# Patient Record
Sex: Female | Born: 1970 | Race: Black or African American | Hispanic: No | Smoking: Never smoker
Health system: Southern US, Community
[De-identification: ages and names within clinical notes are randomized; demographics above are authoritative.]

## PROBLEM LIST (undated history)

## (undated) DIAGNOSIS — E785 Hyperlipidemia, unspecified: Secondary | ICD-10-CM

## (undated) HISTORY — PX: ROBOTIC ASSISTED LAPAROSCOPIC VAGINAL HYSTERECTOMY WITH FIBROID REMOVAL: SHX5387

## (undated) HISTORY — PX: NECK SURGERY: SHX720

## (undated) HISTORY — PX: ABDOMINAL HYSTERECTOMY: SHX81

## (undated) HISTORY — PX: ELBOW SURGERY: SHX618

---

## 1998-01-15 ENCOUNTER — Observation Stay (HOSPITAL_COMMUNITY): Admission: EM | Admit: 1998-01-15 | Discharge: 1998-01-15 | Payer: Self-pay | Admitting: Emergency Medicine

## 1998-01-15 ENCOUNTER — Inpatient Hospital Stay (HOSPITAL_COMMUNITY): Admission: AD | Admit: 1998-01-15 | Discharge: 1998-01-17 | Payer: Self-pay | Admitting: *Deleted

## 1998-08-06 ENCOUNTER — Ambulatory Visit (HOSPITAL_COMMUNITY): Admission: RE | Admit: 1998-08-06 | Discharge: 1998-08-06 | Payer: Self-pay | Admitting: Family Medicine

## 1998-08-07 ENCOUNTER — Encounter: Payer: Self-pay | Admitting: Family Medicine

## 1998-08-20 ENCOUNTER — Other Ambulatory Visit: Admission: RE | Admit: 1998-08-20 | Discharge: 1998-08-20 | Payer: Self-pay | Admitting: Family Medicine

## 1998-08-27 ENCOUNTER — Ambulatory Visit (HOSPITAL_COMMUNITY): Admission: RE | Admit: 1998-08-27 | Discharge: 1998-08-27 | Payer: Self-pay | Admitting: Family Medicine

## 1998-08-27 ENCOUNTER — Encounter: Payer: Self-pay | Admitting: Family Medicine

## 1998-10-04 ENCOUNTER — Ambulatory Visit (HOSPITAL_BASED_OUTPATIENT_CLINIC_OR_DEPARTMENT_OTHER): Admission: RE | Admit: 1998-10-04 | Discharge: 1998-10-04 | Payer: Self-pay | Admitting: Otolaryngology

## 1999-03-25 ENCOUNTER — Encounter: Payer: Self-pay | Admitting: Emergency Medicine

## 1999-03-25 ENCOUNTER — Emergency Department (HOSPITAL_COMMUNITY): Admission: EM | Admit: 1999-03-25 | Discharge: 1999-03-25 | Payer: Self-pay | Admitting: Emergency Medicine

## 2000-03-29 ENCOUNTER — Other Ambulatory Visit: Admission: RE | Admit: 2000-03-29 | Discharge: 2000-03-29 | Payer: Self-pay | Admitting: Obstetrics and Gynecology

## 2001-04-11 ENCOUNTER — Other Ambulatory Visit: Admission: RE | Admit: 2001-04-11 | Discharge: 2001-04-11 | Payer: Self-pay | Admitting: Obstetrics and Gynecology

## 2002-03-09 ENCOUNTER — Emergency Department (HOSPITAL_COMMUNITY): Admission: EM | Admit: 2002-03-09 | Discharge: 2002-03-09 | Payer: Self-pay | Admitting: *Deleted

## 2002-07-14 ENCOUNTER — Inpatient Hospital Stay (HOSPITAL_COMMUNITY): Admission: AD | Admit: 2002-07-14 | Discharge: 2002-07-14 | Payer: Self-pay | Admitting: Obstetrics and Gynecology

## 2002-09-27 ENCOUNTER — Inpatient Hospital Stay (HOSPITAL_COMMUNITY): Admission: RE | Admit: 2002-09-27 | Discharge: 2002-09-28 | Payer: Self-pay | Admitting: Gynecology

## 2002-09-27 ENCOUNTER — Encounter (INDEPENDENT_AMBULATORY_CARE_PROVIDER_SITE_OTHER): Payer: Self-pay | Admitting: Specialist

## 2003-12-17 ENCOUNTER — Encounter: Admission: RE | Admit: 2003-12-17 | Discharge: 2003-12-17 | Payer: Self-pay | Admitting: Gynecology

## 2004-07-01 ENCOUNTER — Other Ambulatory Visit: Admission: RE | Admit: 2004-07-01 | Discharge: 2004-07-01 | Payer: Self-pay | Admitting: Gynecology

## 2005-04-27 ENCOUNTER — Other Ambulatory Visit: Admission: RE | Admit: 2005-04-27 | Discharge: 2005-04-27 | Payer: Self-pay | Admitting: Gynecology

## 2005-05-12 ENCOUNTER — Encounter: Admission: RE | Admit: 2005-05-12 | Discharge: 2005-05-12 | Payer: Self-pay | Admitting: Gynecology

## 2005-09-01 ENCOUNTER — Encounter: Admission: RE | Admit: 2005-09-01 | Discharge: 2005-09-01 | Payer: Self-pay | Admitting: Family Medicine

## 2005-09-16 ENCOUNTER — Encounter: Admission: RE | Admit: 2005-09-16 | Discharge: 2005-09-16 | Payer: Self-pay | Admitting: Neurological Surgery

## 2005-10-28 ENCOUNTER — Ambulatory Visit (HOSPITAL_COMMUNITY): Admission: RE | Admit: 2005-10-28 | Discharge: 2005-10-29 | Payer: Self-pay | Admitting: Neurological Surgery

## 2005-12-01 ENCOUNTER — Encounter: Admission: RE | Admit: 2005-12-01 | Discharge: 2005-12-01 | Payer: Self-pay | Admitting: Neurological Surgery

## 2005-12-15 ENCOUNTER — Emergency Department (HOSPITAL_COMMUNITY): Admission: EM | Admit: 2005-12-15 | Discharge: 2005-12-15 | Payer: Self-pay | Admitting: Emergency Medicine

## 2006-02-01 ENCOUNTER — Encounter: Admission: RE | Admit: 2006-02-01 | Discharge: 2006-02-01 | Payer: Self-pay | Admitting: Neurological Surgery

## 2006-04-20 ENCOUNTER — Emergency Department (HOSPITAL_COMMUNITY): Admission: EM | Admit: 2006-04-20 | Discharge: 2006-04-20 | Payer: Self-pay | Admitting: *Deleted

## 2006-05-01 ENCOUNTER — Inpatient Hospital Stay (HOSPITAL_COMMUNITY): Admission: AD | Admit: 2006-05-01 | Discharge: 2006-05-01 | Payer: Self-pay | Admitting: Obstetrics and Gynecology

## 2006-05-03 ENCOUNTER — Encounter: Admission: RE | Admit: 2006-05-03 | Discharge: 2006-05-03 | Payer: Self-pay | Admitting: Neurological Surgery

## 2006-05-11 ENCOUNTER — Other Ambulatory Visit: Admission: RE | Admit: 2006-05-11 | Discharge: 2006-05-11 | Payer: Self-pay | Admitting: Gynecology

## 2006-06-16 ENCOUNTER — Encounter: Admission: RE | Admit: 2006-06-16 | Discharge: 2006-06-16 | Payer: Self-pay | Admitting: Gynecology

## 2006-08-10 ENCOUNTER — Emergency Department (HOSPITAL_COMMUNITY): Admission: EM | Admit: 2006-08-10 | Discharge: 2006-08-10 | Payer: Self-pay | Admitting: Emergency Medicine

## 2006-09-09 ENCOUNTER — Inpatient Hospital Stay (HOSPITAL_COMMUNITY): Admission: AD | Admit: 2006-09-09 | Discharge: 2006-09-09 | Payer: Self-pay | Admitting: Gynecology

## 2006-10-08 ENCOUNTER — Inpatient Hospital Stay (HOSPITAL_COMMUNITY): Admission: AD | Admit: 2006-10-08 | Discharge: 2006-10-08 | Payer: Self-pay | Admitting: Gynecology

## 2006-11-29 ENCOUNTER — Emergency Department (HOSPITAL_COMMUNITY): Admission: EM | Admit: 2006-11-29 | Discharge: 2006-11-29 | Payer: Self-pay | Admitting: Emergency Medicine

## 2006-12-29 ENCOUNTER — Inpatient Hospital Stay (HOSPITAL_COMMUNITY): Admission: AD | Admit: 2006-12-29 | Discharge: 2006-12-29 | Payer: Self-pay | Admitting: Gynecology

## 2007-07-05 ENCOUNTER — Other Ambulatory Visit: Admission: RE | Admit: 2007-07-05 | Discharge: 2007-07-05 | Payer: Self-pay | Admitting: Gynecology

## 2007-11-08 ENCOUNTER — Emergency Department (HOSPITAL_COMMUNITY): Admission: EM | Admit: 2007-11-08 | Discharge: 2007-11-09 | Payer: Self-pay | Admitting: Emergency Medicine

## 2009-05-06 ENCOUNTER — Encounter: Admission: RE | Admit: 2009-05-06 | Discharge: 2009-05-06 | Payer: Self-pay | Admitting: Gynecology

## 2009-05-13 ENCOUNTER — Encounter: Admission: RE | Admit: 2009-05-13 | Discharge: 2009-05-13 | Payer: Self-pay | Admitting: Gynecology

## 2009-06-03 ENCOUNTER — Emergency Department (HOSPITAL_COMMUNITY): Admission: EM | Admit: 2009-06-03 | Discharge: 2009-06-03 | Payer: Self-pay | Admitting: Emergency Medicine

## 2009-06-04 ENCOUNTER — Encounter: Admission: RE | Admit: 2009-06-04 | Discharge: 2009-06-04 | Payer: Self-pay | Admitting: Gynecology

## 2010-02-06 ENCOUNTER — Encounter (INDEPENDENT_AMBULATORY_CARE_PROVIDER_SITE_OTHER): Payer: Self-pay | Admitting: Obstetrics and Gynecology

## 2010-02-06 ENCOUNTER — Inpatient Hospital Stay (HOSPITAL_COMMUNITY): Admission: RE | Admit: 2010-02-06 | Discharge: 2010-02-09 | Payer: Self-pay | Admitting: Obstetrics and Gynecology

## 2010-02-16 ENCOUNTER — Inpatient Hospital Stay (HOSPITAL_COMMUNITY)
Admission: AD | Admit: 2010-02-16 | Discharge: 2010-02-19 | Payer: Self-pay | Source: Home / Self Care | Attending: Obstetrics and Gynecology | Admitting: Obstetrics and Gynecology

## 2010-03-30 ENCOUNTER — Encounter: Payer: Self-pay | Admitting: Gynecology

## 2010-03-30 ENCOUNTER — Encounter: Payer: Self-pay | Admitting: Gastroenterology

## 2010-03-30 ENCOUNTER — Encounter: Payer: Self-pay | Admitting: Neurological Surgery

## 2010-05-16 NOTE — Discharge Summary (Signed)
Mariah Anderson, Mariah Anderson               ACCOUNT NO.:  [id number]  MEDICAL RECORD NO.:  [id number]          PATIENT TYPE:  INP  LOCATION:  9303                          FACILITY:  [facility]  PHYSICIAN:  Dineen Kid. Rana Snare, M.D.    DATE OF BIRTH:  1970/03/16  DATE OF ADMISSION:  02/06/2010 DATE OF DISCHARGE:  02/09/2010                              DISCHARGE SUMMARY   HISTORY OF PRESENT ILLNESS:  Ms. Pflug is a 40 year old with worsening problems of pelvic pain and known history of adhesion.  She has had several surgeries in the past.  I told her that the next time she has surgery, she is to have these taken down and taken out as a recurrence of adhesions.  She has also has a left simple ovarian cyst, worsening problems with pelvic pain, known fibroids and abnormal bleeding.  She desires definitive surgical intervention and presents for hysterectomy. She desires preservation of both ovaries if at all possible.  She does have a long history of narcotic dependency and currently is on OxyContin for her back and understands that this may cause a postoperative pain management problem.  She does give informed consent and wished to proceed.  HOSPITAL COURSE:  The patient underwent a total abdominal hysterectomy with lysis of adhesions and left ovarian cystectomy.  At the time of surgery, we did a 8-week size fibroid uterus with multiple bowel and omental adhesions of the uterus and bilateral adnexa.  She did have a left simple ovarian cyst, normal-appearing right ovarian cyst and normal- appearing appendix.  The left ovary was preserved.  The surgery was uncomplicated with an estimated blood loss of 100 mL.  Her postoperative care was relatively unremarkable by postoperative day #1.  She had a postoperative hemoglobin of 10.2.  Her abdomen was soft and nontender with normoactive bowel sounds and a clean, dry incision, had difficulty maintaining pain control despite PCA Dilaudid and reinstituting  her OxyContin to a point that she would become sedated and not be able to ambulate.  By postoperative #2, she was tolerating regular diet, was able to ambulate.  We discontinued her Dilaudid PCA and switched her to oral Dilaudid, choosing moderate pain control.  By postoperative day #3, she remained afebrile.  She has normalized bowel sounds.  Incision was clean, dry and intact.  She was tolerating a regular diet, able to pass flatus and ambulating well.  She was discharged home.  I had a lengthy discussion with regarding her need to try to reduce the amount of Dilaudid that she was taking and to have an acceptable level of pain, so that she would be able to function and normal resumption of bowel function.  We did repeat a CBC and showed her stable hemoglobin and reassured her that she basically had to remain stable throughout her hospital course and the patient was discharged home.  I did send her on prescription for Dilaudid 2 mg #50.  I have instructed her to wean from these and not to rely on these at a time.  She does have a prescription for OxyContin.  She is to followup in the office in  2 weeks for routine check.  She is told to return for increased pain, fever or bleeding. Staples were removed before discharge.  Discharge condition was good.     Dineen Kid Rana Snare, M.D.     DCL/MEDQ  D:  02/27/2010  T:  02/28/2010  Job:  [id number]  Electronically Signed by Candice Camp M.D. on 05/15/2010 10:28:15 AM

## 2010-05-19 LAB — URINE CULTURE
Culture  Setup Time: 201112121916
Culture: NO GROWTH
Special Requests: POSITIVE

## 2010-05-19 LAB — COMPREHENSIVE METABOLIC PANEL
ALT: 12 U/L (ref 0–35)
BUN: 3 mg/dL — ABNORMAL LOW (ref 6–23)
Calcium: 9.5 mg/dL (ref 8.4–10.5)
Glucose, Bld: 96 mg/dL (ref 70–99)
Sodium: 139 mEq/L (ref 135–145)
Total Protein: 7.3 g/dL (ref 6.0–8.3)

## 2010-05-19 LAB — CBC
HCT: 30.2 % — ABNORMAL LOW (ref 36.0–46.0)
HCT: 37.2 % (ref 36.0–46.0)
Hemoglobin: 10.2 g/dL — ABNORMAL LOW (ref 12.0–15.0)
MCH: 31.1 pg (ref 26.0–34.0)
MCH: 31.4 pg (ref 26.0–34.0)
MCHC: 33 g/dL (ref 30.0–36.0)
MCHC: 33.6 g/dL (ref 30.0–36.0)
MCHC: 33.7 g/dL (ref 30.0–36.0)
MCV: 92.4 fL (ref 78.0–100.0)
MCV: 93.5 fL (ref 78.0–100.0)
Platelets: 235 10*3/uL (ref 150–400)
Platelets: 436 10*3/uL — ABNORMAL HIGH (ref 150–400)
RBC: 3.2 MIL/uL — ABNORMAL LOW (ref 3.87–5.11)
RDW: 13.9 % (ref 11.5–15.5)

## 2010-05-19 LAB — DIFFERENTIAL
Lymphs Abs: 1.9 10*3/uL (ref 0.7–4.0)
Monocytes Relative: 7 % (ref 3–12)
Neutro Abs: 2.6 10*3/uL (ref 1.7–7.7)
Neutrophils Relative %: 54 % (ref 43–77)

## 2010-05-20 LAB — COMPREHENSIVE METABOLIC PANEL WITH GFR
ALT: 13 U/L (ref 0–35)
AST: 21 U/L (ref 0–37)
Albumin: 3.8 g/dL (ref 3.5–5.2)
Alkaline Phosphatase: 50 U/L (ref 39–117)
BUN: 3 mg/dL — ABNORMAL LOW (ref 6–23)
CO2: 27 meq/L (ref 19–32)
Calcium: 9.1 mg/dL (ref 8.4–10.5)
Chloride: 100 meq/L (ref 96–112)
Creatinine, Ser: 0.98 mg/dL (ref 0.4–1.2)
GFR calc non Af Amer: >60 mL/min
Glucose, Bld: 90 mg/dL (ref 70–99)
Potassium: 3.1 meq/L — ABNORMAL LOW (ref 3.5–5.1)
Sodium: 135 meq/L (ref 135–145)
Total Bilirubin: 0.2 mg/dL — ABNORMAL LOW (ref 0.3–1.2)
Total Protein: 7.5 g/dL (ref 6.0–8.3)

## 2010-05-20 LAB — CBC
MCHC: 33.4 g/dL (ref 30.0–36.0)
RDW: 14.2 % (ref 11.5–15.5)

## 2010-05-20 LAB — SURGICAL PCR SCREEN
MRSA, PCR: NEGATIVE
Staphylococcus aureus: NEGATIVE

## 2010-07-15 NOTE — Discharge Summary (Signed)
  Mariah Anderson, Mariah Anderson               ACCOUNT NO.:  [id number]  MEDICAL RECORD NO.:  [id number]          PATIENT TYPE:  INP  LOCATION:  9303                          [facility]ACILITY:  [facility]  PHYSICIAN:  Dineen Kid. Rana Snare, M.D.    DATE OF BIRTH:  [birth year]  DATE OF ADMISSION:  02/16/2010 DATE OF DISCHARGE:  02/19/2010                              DISCHARGE SUMMARY   HISTORY OF PRESENT ILLNESS:  [name] is a 40 year old black female status post total abdominal hysterectomy, lysis of adhesions and removal of left ovarian cyst which was performed of February 06, 2010.  She presented complaining of worsening left-sided pain, 10/10 on pain scale. Since the surgery, she has not had a bowel movement in 7 days.  She has had occasional fever and chills.  She had some nausea and vomiting.  She was seen in the office several days prior to admission and started on antibiotics.  She presented to [facility] for evaluation and admitted by Dr. Vincente Poli for further observation.  HOSPITAL COURSE:  The patient was admitted for pain management.  She was given IV Stadol.  A CAT scan was ordered as well as laboratory evaluation.  The laboratory evaluation revealed a normal white count of 4.9 with a hemoglobin 12.3 and she also had a normal comprehensive metabolic package.  Her  CAT scan revealed a fluid collection within the ventral pelvic wall and some signs of increasing gas in the pelvis just below the rectus muscle.  This was felt to most likely represent fluid within the  rectus muscles.  There was also noted a distended bladder. The patient was given pain medication, was feeling somewhat better.  Her exam was really noncontributory but she had a completely dry and intact incision.  Pain was questionable in left lower quadrant, 1 out of 5 to deep palpation.  She was afebrile.  I reviewed the CT scan with the radiologist and also discussed with the patient at length.  The patient continued to complain of  intermittent left lower quadrant pain.  We tried enemas, increase Dulcolax suppositories.  The patient reported that pain was better.  She just does not feel right.  She was having some difficulty with voiding.  After voiding, we did a postvoid residual which basically showed that she was emptying completely.  After recommended she double void, she was able to void completely, was able to have bowel movements without difficulty.  She remained afebrile throughout the hospital course.  On February 19, 2010, her abdomen was soft, nontender, nondistended with normoactive bowel sounds and normal voiding function.  The patient was discharged home.  DISCHARGE DIAGNOSIS:  Postoperative pain, bowel dysfunction and bladder dysfunction but normal workup and evaluation.  DISPOSITION:  The patient discharged home, will followup in the office or return for increased pain, fever, or bleeding.    Dineen Kid Rana Snare, M.D.    DCL/MEDQ  D:  07/08/2010  T:  07/09/2010  Job:  [id number]  Electronically Signed by Candice Camp M.D. on 07/15/2010 09:05:04 AM

## 2010-07-15 NOTE — H&P (Signed)
  Mariah Anderson, Mariah Anderson               ACCOUNT NO.:  [id number]  MEDICAL RECORD NO.:  [id number]          PATIENT TYPE:  INP  LOCATION:  9302                          [facility]ACILITY:  [facility]  PHYSICIAN:  Dineen Kid. Rana Snare, M.D.    DATE OF BIRTH:  17-May-1970  DATE OF ADMISSION:  02/06/2010 DATE OF DISCHARGE:  02/09/2010                             HISTORY & PHYSICAL   HISTORY OF PRESENT ILLNESS:  Ms Swalley is a 40 year old with worsening pelvic pain with a known history of pelvic adhesions.  She has had multiple surgeries in the past.  I told her the next she has surgery to have her uterus taken out due to the chronic adhesions, she also has ovarian cyst, worsening problems with recent abnormal bleeding.  She desires surgical intervention and presents for hysterectomy.  PHYSICAL EXAMINATION:  PULMONARY:  Heart is regular rate and rhythm. LUNGS:  Clear to auscultation bilaterally. ABDOMEN:  Nondistended, nontender.  well healed scar. PELVIC:  Uterus is 10 weeks size.  Irregular, consistent with fibroid.  MEDICATIONS:  Include Lamictal and OxyContin due to history of ongoing chronic pain in her back.  ALLERGIES:  She has no known drug allergies.  SURGERIES:  She has had multiple abdominal surgeries due to pelvic pain and adhesions.  IMPRESSION:  Pelvic pain, uterine fibroids, abnormal uterine bleeding, pelvic adhesion, left ovarian cyst.  PLAN:  Total abdominal hysterectomy, lysis of adhesions, evaluation of probable left ovarian cystectomy, possible removal of left tube and ovary.  Discussed the surgery with risks, benefits, our goal would be to obviously alleviate her pain.  She does desire definitive surgical intervention, does desire preservation of both ovaries though we discussed risks and benefits at length which include but are not limited to risk of infection, bleeding, damage to bowel, bladder, ureters, ovaries, possibly her pain may not be alleviated or it can recur or worsen, risk  of blood transfusion, injury to bowel and bladder.  She does give her informed consent and wished to proceed.  I also had a lengthy discussion with her pain tolerance and dependency on OxyContin.  She does understand this may be an issue.     Dineen Kid Rana Snare, M.D.     DCL/MEDQ  D:  07/08/2010  T:  07/09/2010  Job:  [id number]  Electronically Signed by Candice Camp M.D. on 07/15/2010 09:04:57 AM

## 2010-07-25 NOTE — H&P (Signed)
NAME:  Mariah Anderson, Mariah Anderson                        ACCOUNT NO.:  [id number]   MEDICAL RECORD NO.:  [id number]                   PATIENT TYPE:  INP   LOCATION:  0005                                 FACILITY:  [facility]   PHYSICIAN:  Leatha Gilding. Mezer, M.D.               DATE OF BIRTH:  Sep 30, 1970   DATE OF ADMISSION:  09/27/2002  DATE OF DISCHARGE:                                HISTORY & PHYSICAL   ADMITTING DIAGNOSES:  Fibroid uterus and pelvic pain.   HISTORY OF PRESENT ILLNESS:  The patient is a 40 year old, gravida 1, para  1, African-American female, who is admitted with fibroids, pelvic pain,  dysmenorrhea, and dyspareunia for exploratory laparotomy and myomectomy.  The patient's symptoms have increased significantly over the last year and  wishes to undergo a myomectomy.  Abdominal myomectomy has been discussed in  detail with the patient and potential complications including but not  limited to anesthesia; injury to the bowel, bladder, ureters; possible  postoperative adhesions; possible need for hysterectomy have been discussed  with the patient in detail.  There is no guarantee to relieve the patient's  pelvic pain and dysmenorrhea, dyspareunia, or for her to become pregnant  postoperatively.  The potential postoperative adhesions and their  consequences including continued or increased pelvic pain has been discussed  in detail.  The potential for transfusions has been discussed as well as the  sequelae.  Postoperative expectations and restrictions have been reviewed in  detail.  It is likely the patient will be instructed not to get pregnant for  six months postoperatively because of the risk of uterine rupture.  Postoperative pain control has been discussed.  The patient appears to have  a good understanding of the procedure and the options and has realistic  expectations for outcome.   PAST MEDICAL HISTORY:   SURGICAL:  Vocal cords.   MEDICAL:  Noncontributory.   MEDICATIONS:  None.  No herbs or supplements.   ALLERGIES:  None known.   FAMILY HISTORY:  Positive for coronary artery disease and colon cancer as  well as for fibroids.   SOCIAL HISTORY:  The patient is married.   PHYSICAL EXAMINATION:  HEENT:  Negative.  LUNGS:  Clear.  HEART:  Without murmurs.  BREASTS:  The right breast is negative.  The left breast had a 2 x 3 cm mass  deep in the upper outer quadrant and was seen by Currie Paris, M.D.  preoperatively, who performed an ultrasound, identified this mass as cystic  and aspirated fluid.  The abdomen is soft and nontender.  The pelvic exam  reveals vagina and cervix to be normal.  The uterus is approximately 14  weeks in size and irregular with a prominent fundal myoma.  The adnexa are  without palpable masses.  EXTREMITIES:  Negative.   IMPRESSION:  1. A fibroid uterus.  2. Dysmenorrhea.  3. Dyspareunia.  4. Pelvic pain.  PLAN:  Exploratory laparotomy with myomectomy.                                               Leatha Gilding. Mezer, M.D.    HCM/MEDQ  D:  09/27/2002  T:  09/27/2002  Job:  [id number]   cc:   Leona Singleton, M.D.  [address]  [place]  [place] [address]  Fax: [phone]

## 2010-07-25 NOTE — Op Note (Signed)
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   PROCEDURES:  1. Decompressive anterior cervical diskectomy C5-6.  2. Anterior cervical arthrodesis C5-6 utilizing a 7 mm PEEK interbody cage      packed with a local autograft and DBX putty.  3. Anterior cervical plating C5-6 utilizing a 24 mm Accufix plate.   SURGEON:  Dr. Marikay Alar.   ASSISTANT:  Donalee Citrin, M.D.   ANESTHESIA:  General endotracheal.   COMPLICATIONS:  None apparent.   INDICATIONS FOR PROCEDURE:  Mariah Anderson is a 40 year old female who was  referred with numbness in the right side of her face, pain in her right  shoulder with pain down the right arm.  She had had a CT scan of the  cervical spine for workup of this which showed spondylosis at C5-6 on the  right side.  I recommended MRI of the brain, MRI of the cervical spine to  further work this up.  Her MRI of the brain was unimpressive.  Her MRI of  the cervical spine showed spondylosis with a small disk herniation at C5-6  on the right side with neural foraminal narrowing at C5-6 on the right.  We  tried medical management requested at that time without significant relief.  She returned complaining of worsening right arm pain.  I recommended  anterior cervical diskectomy and fusion with plating at C5-6.  She  understood the risks, benefits and expected outcome and she wished to  proceed.   DESCRIPTION OF PROCEDURE:  The patient was taken to the operating room  and  after induction of adequate generalized endotracheal anesthesia, she was  placed in the supine position on the operating table.  Her right anterior  cervical region was prepped with DuraPrep and then draped in the usual  sterile fashion.  We injected 3 mL of local anesthesia and a small  transverse incision was made to the right of midline and carried down to the  platysma which was elevated, opened and undermined with Metzenbaum scissors.  I then dissected in a plane medial to the sternocleidomastoid muscle and  internal carotid artery and lateral to the trachea and esophagus to expose  C5-6.  Intraoperative fluoroscopy confirmed my level and then the longus  colli muscles were taken down and the Shadow Line retractors were placed  under this.  The C5-6 annulus was incised and the initial diskectomy was  done with a pituitary rongeurs and curved curettes.  We then used the high-  speed drill to drill the endplates down to the level  of the posterior  longitudinal ligament.  We saved drill shavings in a mucous trap for later  arthrodesis.  The operating microscope was brought into the field and a 1 mm  Kerrison was used along with a black nerve hook to open the posterior  longitudinal ligament and then undercut the bodies of C5 and C6 in a  circumferential fashion removing the posterior osteophytes and the posterior  longitudinal ligament.  The left neural foramen was wide open preoperatively  and she had no left arm symptoms, therefore minimal foraminotomy was  performed on the left side.  However, on the right side, we spent  considerable time performing a wide generous foraminotomy over the C6 nerve  root.  We identified the medial pedicle border and followed it out  superiorly to identify the nerve root and followed it out into the foramen.  There was significant spurring over the nerve root in the foramen at C5-6 on  the right side especially superiorly.  This was removed with a 1  and 2 mm  Kerrison punch.  We then palpated with a black nerve hook to assure adequate  decompression of the C6 nerve root.  The C6 nerve root was free.  We  undercut the body of C5 somewhat further to decompress the central canal.  We then inspected our decompression.  The dura was capacious and full the  way across.  We felt like we had a good decompression of the C6 nerve root  on the right side which was our goal.  We then irrigated with saline  solution containing bacitracin, dried the surgical bed with Gelfoam, removed  the Gelfoam and then used a 7 mm PEEK interbody cage after measuring the  interspace and tapped this into position after packing it with local  autograft and PBX putty.  We then used a 24 mm Accufix plate and placed two  13 mm variable angle screws in the bodies of C5-C6, respectively, and these  locked into position by the locking mechanism on the plate.  We then  irrigated with saline solution containing bacitracin.  We dried all bleeding  points with bipolar cautery and once meticulous hemostasis was achieved,  closed the platysma with 3-0 Vicryl, closed the subcuticular tissue with 3-0  Vicryl and closed skin with Benzoin and Steri-Strips.  The drapes were  removed and a sterile dressing was applied.  The patient was awakened from  general anesthesia and transferred to the recovery room in stable condition.  At the end of the procedure, all sponge, needle and instrument counts were  correct.      Tia Alert, MD  Electronically Signed     DSJ/MEDQ  D:  10/28/2005  T:  10/28/2005  Job:  [id number]

## 2010-07-25 NOTE — Consult Note (Signed)
NAMEMARYSOL, WELLNITZ NO.:  [id number]   MEDICAL RECORD NO.:  [id number]          PATIENT TYPE:  MAT   LOCATION:  MATC                          FACILITY:  [facility]   PHYSICIAN:  Daniel L. Gottsegen, M.D.DATE OF BIRTH:  1970-11-04   DATE OF CONSULTATION:  05/01/2006  DATE OF DISCHARGE:                                 CONSULTATION   Mrs. Karle is a 40 year old gravida 1, para 1, AB 0 who has been  followed by Dr. Chevis Pretty with fibroids associated with pelvic pain and  menorrhagia.  She had previously had a myomectomy by Dr. Chevis Pretty and  unfortunately has had a regrowth of her fibroids.  She recently  underwent HSG because she is still contemplating having a second baby.  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  She called me last night because after having  had her usual period 2 weeks ago, she started to spot.  She had spotted  for several days and then last night it became very, very heavy.  She  had to change her pad every hour.  It was heavy with cramping.  When we  talked last night, I told her I was reluctant to give her any medication  to stop it because of the concern that she could be pregnant.  She was  advised to stay off her feet and use either Advil or Aleve and then to  call me back if it persisted, and we would see her.  It persisted fairly  well through the night.  She came to the emergency room this morning at  my request and actually in the process of coming to the emergency room  and being assessed, it has actually almost stopped.  A CBC and a  qualitative HCG were done in the emergency room.  At the time of this  dictation, the qualitative HCG is not available, but the hemoglobin was  only slightly low at 11.4.  She had been checked in his office about a  month ago and at that point had a normal hemoglobin.   EXAMINATION:  GENERAL:  The patient is a well-developed, well-nourished  female in no acute  distress.  VITAL SIGNS:  Blood pressure, pulse were stable.  ABDOMEN:  Soft guarding, rebound or masses.  PELVIC:  External is normal; BUS is normal.  Vaginal is normal.  Cervix  is clean.  There is just some very light spotting coming from her  external os.  Uterus is enlarged by multiple myomas to about 8-9 weeks'  size.  Adnexa failed to reveal masses.  She is nontender on exam.   IMPRESSION:  Myomas with menometrorrhagia.   PLAN:  I think for the moment she is stable and no prescription is  needed.  We will keep her until we are sure her pregnancy test is  negative..  Assuming that it is negative, I gave her a prescription for  Megace 40 mg #40.  She will take 1 b.i.d. to q.i.d. depending on the  bleeding in order to control a reoccurrence of this  bleeding.  She will  do that over the weekend.  Assuming it does not recur, she will just  call Dr. Chevis Pretty on Monday for further assessment.      Daniel L. Eda Paschal, M.D.  Electronically Signed     DLG/MEDQ  D:  05/01/2006  T:  05/01/2006  Job:  [id number]   cc:   Leatha Gilding. Mezer, M.D.  Fax: [phone]

## 2010-07-25 NOTE — Op Note (Signed)
NAME:  Mariah Anderson, Mariah Anderson                        ACCOUNT NO.:  [id number]   MEDICAL RECORD NO.:  [id number]                   PATIENT TYPE:  INP   LOCATION:  0005                                 FACILITY:  [facility]   PHYSICIAN:  Leatha Gilding. Mezer, M.D.               DATE OF BIRTH:  07-19-1970   DATE OF PROCEDURE:  09/27/2002  DATE OF DISCHARGE:                                 OPERATIVE REPORT   PREOPERATIVE DIAGNOSES:  Fibroid uterus, pelvic pain.   POSTOPERATIVE DIAGNOSES:  Fibroid uterus, pelvic pain. Adhesions.   OPERATION PERFORMED:  1. Exploratory laparotomy with myomectomy.  2. Extensive lysis of adhesions.   SURGEON:  Leatha Gilding. Mezer, M.D.   ASSISTANT:  Leona Singleton, M.D.   ANESTHESIA:  General endotracheal.   PREPARATION:  Betadine.   DESCRIPTION OF PROCEDURE:  With the patient in the supine position, she was  prepped and draped in routine fashion. A Pfannenstiel incision was made  through the skin and subcutaneous tissue. The fascia and peritoneum were  opened without difficulty. Brief exploration of her upper abdomen was  benign. Exploration of the pelvis revealed the uterus to be approximately 14  weeks in size with a large fundal myoma and a small myoma on the anterior  surface of the uterus. There were adhesions of bowel extending from the  posterior aspect of the uterus and involving both adnexa. Both ovaries were  adherent to the pelvic sidewall, both fallopian tubes were adherent to the  uterus and to the bowel on the left side. As much time was spent with lysis  of adhesions as was spent with the myomectomy. This degree of adhesions is  not anticipated when performing a myomectomy and is a distinctly separate  procedure. These adhesions were taken down in layers with cautery and  sharply as indicated. Very careful attention was paid to hemostasis as the  adhesions bled very easily when cut. Care was taken to protect the  underlying organs and particularly the  ureter and uterine artery and vein.  Great care was also taken to protect the bowel from injury. The left adnexa  was totally freed. The adhesions affecting the fallopian tube being adherent  to the uterus were taken down on the right. The right ovary was very  significantly in the pelvic sidewall and not adherent to the uterus and was  not extracted for fear that worse adhesions and worse pain would develop  postoperatively. Attention was then directed to the second procedure. A  solution of dilute Pitressin was injected at the base of the large fundal  fibroid. It was circumscribed below the equator and the fibroid removed. The  base of the myometrium was cauterized to achieve hemostasis. The fibroid was  deep in the myometrium but did not jeopardize the endometrial cavity. The  myometrial defect was closed in layers with a running #0 Chromic suture  followed by interrupted #0 Chromic sutures.  The serosal surface was closed  with a baseball type stitch of 3-0 PDS. A small anterior fibroid was removed  leaving a small defect which was closed with 3-0 PDS. Meticulous attention  was paid to hemostasis and at the completion of the procedure, an effort was  made to place the large bowel in the cul-de-sac. The omentum was brought  down. The bowel was somewhat dilated, possibly secondary to the use of  nitrous as an inhalation agent. The abdomen was then closed in layers using  a running 2-0 Vicryl on the peritoneum, running 0 Vicryl at the midline  bilaterally on the fascia. Hemostasis was assured in the subcutaneous  tissue, the skin was  closed with staples. The estimated blood loss was approximately 100 mL. The  sponge, needle and instrument counts were correct x2. The patient tolerated  the procedure well and was taken to the recovery room in satisfactory  condition.                                               Leatha Gilding. Mezer, M.D.    HCM/MEDQ  D:  09/27/2002  T:  09/27/2002  Job:   [id number]   cc:   Leona Singleton, M.D.  [address]  [place]  [place] [address]  Fax: [phone]

## 2010-10-29 ENCOUNTER — Ambulatory Visit
Admission: RE | Admit: 2010-10-29 | Discharge: 2010-10-29 | Disposition: A | Discharge status: Home / Self Care | Payer: 59 | Source: Ambulatory Visit | Attending: Neurological Surgery | Admitting: Neurological Surgery

## 2010-10-29 ENCOUNTER — Other Ambulatory Visit: Payer: Self-pay | Admitting: Neurological Surgery

## 2010-10-29 DIAGNOSIS — M542 Cervicalgia: Secondary | ICD-10-CM

## 2010-10-29 DIAGNOSIS — M545 Low back pain: Secondary | ICD-10-CM

## 2010-11-27 ENCOUNTER — Encounter (HOSPITAL_COMMUNITY)
Admission: RE | Admit: 2010-11-27 | Discharge: 2010-11-27 | Disposition: A | Discharge status: Home / Self Care | Payer: 59 | Source: Ambulatory Visit | Attending: Neurological Surgery | Admitting: Neurological Surgery

## 2010-11-27 ENCOUNTER — Other Ambulatory Visit (HOSPITAL_COMMUNITY): Payer: Self-pay | Admitting: Neurological Surgery

## 2010-11-27 ENCOUNTER — Ambulatory Visit (HOSPITAL_COMMUNITY)
Admission: RE | Admit: 2010-11-27 | Discharge: 2010-11-27 | Disposition: A | Discharge status: Home / Self Care | Payer: 59 | Source: Ambulatory Visit | Attending: Neurological Surgery | Admitting: Neurological Surgery

## 2010-11-27 DIAGNOSIS — M5030 Other cervical disc degeneration, unspecified cervical region: Secondary | ICD-10-CM | POA: Insufficient documentation

## 2010-11-27 DIAGNOSIS — M5020 Other cervical disc displacement, unspecified cervical region: Secondary | ICD-10-CM

## 2010-11-27 DIAGNOSIS — Z01810 Encounter for preprocedural cardiovascular examination: Secondary | ICD-10-CM | POA: Insufficient documentation

## 2010-11-27 DIAGNOSIS — Z01818 Encounter for other preprocedural examination: Secondary | ICD-10-CM | POA: Insufficient documentation

## 2010-11-27 DIAGNOSIS — Z01812 Encounter for preprocedural laboratory examination: Secondary | ICD-10-CM | POA: Insufficient documentation

## 2010-11-27 DIAGNOSIS — Z01811 Encounter for preprocedural respiratory examination: Secondary | ICD-10-CM

## 2010-11-27 LAB — PROTIME-INR: INR: 1 (ref 0.00–1.49)

## 2010-11-27 LAB — CBC
HCT: 39 % (ref 36.0–46.0)
Platelets: 317 10*3/uL (ref 150–400)
RDW: 12.8 % (ref 11.5–15.5)
WBC: 5.8 10*3/uL (ref 4.0–10.5)

## 2010-11-27 LAB — APTT: aPTT: 33 seconds (ref 24–37)

## 2010-11-27 LAB — DIFFERENTIAL
Basophils Absolute: 0 10*3/uL (ref 0.0–0.1)
Basophils Relative: 0 % (ref 0–1)
Eosinophils Relative: 0 % (ref 0–5)
Lymphocytes Relative: 47 % — ABNORMAL HIGH (ref 12–46)
Neutro Abs: 2.4 10*3/uL (ref 1.7–7.7)

## 2010-11-27 LAB — BASIC METABOLIC PANEL
BUN: 5 mg/dL — ABNORMAL LOW (ref 6–23)
CO2: 27 mEq/L (ref 19–32)
Calcium: 9.7 mg/dL (ref 8.4–10.5)
Chloride: 100 mEq/L (ref 96–112)
Creatinine, Ser: 0.78 mg/dL (ref 0.50–1.10)
Glucose, Bld: 66 mg/dL — ABNORMAL LOW (ref 70–99)

## 2010-12-05 ENCOUNTER — Ambulatory Visit (HOSPITAL_COMMUNITY): Payer: 59

## 2010-12-05 ENCOUNTER — Inpatient Hospital Stay (HOSPITAL_COMMUNITY)
Admission: RE | Admit: 2010-12-05 | Discharge: 2010-12-07 | DRG: 460 | Disposition: A | Discharge status: Home / Self Care | Payer: 59 | Source: Ambulatory Visit | Attending: Neurological Surgery | Admitting: Neurological Surgery

## 2010-12-05 DIAGNOSIS — R1310 Dysphagia, unspecified: Secondary | ICD-10-CM | POA: Diagnosis not present

## 2010-12-05 DIAGNOSIS — M47812 Spondylosis without myelopathy or radiculopathy, cervical region: Principal | ICD-10-CM | POA: Diagnosis present

## 2010-12-05 DIAGNOSIS — Z01810 Encounter for preprocedural cardiovascular examination: Secondary | ICD-10-CM

## 2010-12-05 DIAGNOSIS — Z01818 Encounter for other preprocedural examination: Secondary | ICD-10-CM

## 2010-12-05 DIAGNOSIS — Z01812 Encounter for preprocedural laboratory examination: Secondary | ICD-10-CM

## 2010-12-05 LAB — BASIC METABOLIC PANEL
BUN: 6 mg/dL (ref 6–23)
CO2: 29 mEq/L (ref 19–32)
Chloride: 100 mEq/L (ref 96–112)
GFR calc Af Amer: >60 mL/min (ref >60)
Glucose, Bld: 81 mg/dL (ref 70–99)
Potassium: 3.5 mEq/L (ref 3.5–5.1)

## 2010-12-10 LAB — URINE MICROSCOPIC-ADD ON

## 2010-12-10 LAB — POCT I-STAT, CHEM 8
Chloride: 103
Glucose, Bld: 104 — ABNORMAL HIGH
HCT: 42
Potassium: 3.6

## 2010-12-10 LAB — URINALYSIS, ROUTINE W REFLEX MICROSCOPIC
Bilirubin Urine: NEGATIVE
Glucose, UA: NEGATIVE
Nitrite: NEGATIVE
Specific Gravity, Urine: 1.01
pH: 5

## 2010-12-10 LAB — RAPID URINE DRUG SCREEN, HOSP PERFORMED
Amphetamines: NOT DETECTED
Barbiturates: NOT DETECTED
Tetrahydrocannabinol: NOT DETECTED

## 2010-12-16 NOTE — Discharge Summary (Signed)
  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  PROCEDURES:  Single anterior cervical diskectomy with fusion and plating at C4-5.  BRIEF HISTORY OF PRESENT ILLNESS:  Mariah Anderson is a 40 year old female who underwent a previous ACDF with plating at C5-6 in the remote past and did well with that.  Over time, she has developed neck pain, radiation into the right shoulder.  She had an MRI which showed spondylosis and stenosis at C4-5 adjacent to her previous fusion.  I recommended decompressive anterior cervical diskectomy with fusion and plating at C4-5.  She understood the risks, benefits, and expected outcome and wished to proceed.  HOSPITAL COURSE:  The patient was admitted on December 05, 2010, taken to the operating where she underwent a cervical reexploration with removal of her cervical plate of Z6-1 followed by ACDF with plating at C4-5.  The patient tolerated the procedure, was returned to the recovery room and then to the floor in stable condition.  For details of the operative procedure, please see the dictated operative note.  The patient's hospital course was fairly routine.  She did have some mild dysphagia on postop day #1 and appropriate neck soreness and want to stay in the hospital an extra day.  We felt that was reasonable and her stay for an extra day.  She by postop day #2 had some improvement, no swelling, had mild dysphasia but her trachea was midline.  She had no swelling at her incision site.  Incision was clean, dry and intact.  She had good strength throughout.  She was ambulating normally and  looked comfortable.  She was discharged home on that day with plans to follow up in 2 weeks.  FINAL DIAGNOSIS:  Anterior cervical discectomy and fusion with plating at C4-5 with removal of hardware at C5-6.     Mariah Alert, Mariah Anderson     DSJ/MEDQ  D:  12/07/2010  T:  12/07/2010  Job:  [id number]  Electronically Signed by Mariah Anderson on 12/16/2010 08:55:11 AM

## 2010-12-16 NOTE — Op Note (Signed)
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  PROCEDURES: 1. Cervical re-exploration with removal of anterior cervical hardware,     C5-C6. 2. Decompressive anterior cervical diskectomy, C4-C5 for central canal     and nerve root decompression. 3. Anterior cervical arthrodesis, C4-C5 utilizing a 7-mm PEEK     interbody cage packed with local autograft and morselized     allograft. 4. Anterior cervical plating, C4-C5 utilizing a Zimmer plate.  SURGEON:  Tia Alert, MD  ASSISTANT:  Reinaldo Meeker, MD  ANESTHESIA:  General endotracheal.  COMPLICATIONS:  None apparent.  INDICATIONS FOR PROCEDURE:  Ms. Zuckerman is a 40 year old female who underwent a previous ACDF plating in the remote past at C5-C6.  She had a solid fusion at that level.  She developed neck pain with right shoulder pain.  She had MRI which showed adjacent level stenosis with right-sided neural foraminal stenosis at C4-C5.  She tried medical management for quite some time without significant relief.  I recommended ACDF with plating at C4-C5 with removal of the old plate at H8-I6.  She understood the risks, benefits, expected outcome, and wished to proceed.  DESCRIPTION OF PROCEDURE:  The patient was taken to the operating room and after induction of adequate generalized endotracheal anesthesia, she was placed in supine position on the operating room table.  Her right anterior cervical region was prepped with DuraPrep and then draped in usual sterile  fashion.  Local anesthesia 3 mL was injected and her old incision was opened.  Carried this down to the platysma which was elevated and undermined with Metzenbaum scissors.  I then dissected the plane medial to the sternocleidomastoid muscle and the carotid artery and lateral to the trachea and esophagus to expose the anterior cervical spine.  I was able to expose the old plate with blunt dissection followed by Bovie cautery.  This was an old AcuFix plate.  We were able to remove each screw in succession and then removed the plate.  We then blocked the holes to stop any hemorrhage from the screw holes.  I then dissected superiorly and took down the longus colli muscles to expose the L4-L5.  Intraoperative fluoroscopy confirmed my level.  The Shadow- Line retractors were placed and the annulus was incised.  The initial diskectomy was done with pituitary rongeurs and curved curettes.  I then used the high-speed drill to drill the endplates down to the level of the posterior spurs and posterior longitudinal ligament.  The dural shavings were saved in a mucous trap for latera arthrodesis.  We brought in the operating microscope and I was able to open the posterior longitudinal ligament with a nerve hook and then removed it by undercutting  the bodies of C4 and C5 and opening along the scope up and down.  We performed generous foraminotomy at C4-C5 on the right, only a preforaminal decompression on the left because of the right arm symptoms.  We undercut the vertebral bodies until the dura was full and capacious.  We could see the cord pulsatile through the dura.  We felt like we had adequate decompression by both palpation circumferentially and visualization.  The nerve hook passed easily along the right C5 nerve root.  We irrigated with saline solution, dried the surgical bed, measured the interspace to be 7 mm, used corresponding PEEK interbody cage, packed with local autograft and morselized  allograft, and tapped this into position at C4-C5.  We then used a 22-mm Zimmer plate, placed 24-MW variable angle screws in the bodies of C4 and C5 and then locked these into position.  We then irrigated with saline solution and bacitracin, checked for final construct, dried the surgical bed with bipolar cautery, and with Surgifoam meticulous hemostasis was achieved, closed the platysma with 3-0 Vicryl, closed the subcuticular tissue with 3-0 Vicryl, and closed the skin with the benzoin and Steri-Strips.  The drapes were removed and sterile dressing was applied.  The patient was awakened from general anesthesia and transferred to the recovery room in stable condition.  At the end of the procedure, all sponge, needle, and instrument counts were correct.     Tia Alert, MD     DSJ/MEDQ  D:  12/05/2010  T:  12/05/2010  Job:  [id number]  Electronically Signed by Marikay Alar MD on 12/16/2010 08:55:14 AM

## 2010-12-25 LAB — I-STAT 8, (EC8 V) (CONVERTED LAB)
Acid-base deficit: 5 — ABNORMAL HIGH
Bicarbonate: 20.6
HCT: 46
Operator id: 288331
pCO2, Ven: 37.8 — ABNORMAL LOW

## 2010-12-25 LAB — DIFFERENTIAL
Eosinophils Relative: 0
Lymphocytes Relative: 39
Lymphs Abs: 1.9
Monocytes Absolute: 0.3

## 2010-12-25 LAB — POCT CARDIAC MARKERS
CKMB, poc: <1 — ABNORMAL LOW
Troponin i, poc: <0.05

## 2010-12-25 LAB — CBC
HCT: 40.7
Hemoglobin: 13.7
WBC: 4.9

## 2010-12-25 LAB — POCT I-STAT CREATININE: Creatinine, Ser: 1

## 2011-01-19 ENCOUNTER — Other Ambulatory Visit: Payer: Self-pay | Admitting: Neurological Surgery

## 2011-01-19 ENCOUNTER — Ambulatory Visit
Admission: RE | Admit: 2011-01-19 | Discharge: 2011-01-19 | Disposition: A | Discharge status: Home / Self Care | Payer: 59 | Source: Ambulatory Visit | Attending: Neurological Surgery | Admitting: Neurological Surgery

## 2011-01-19 DIAGNOSIS — M542 Cervicalgia: Secondary | ICD-10-CM

## 2011-04-20 ENCOUNTER — Other Ambulatory Visit: Payer: Self-pay | Admitting: Neurological Surgery

## 2011-04-20 ENCOUNTER — Ambulatory Visit
Admission: RE | Admit: 2011-04-20 | Discharge: 2011-04-20 | Disposition: A | Discharge status: Home / Self Care | Payer: 59 | Source: Ambulatory Visit | Attending: Neurological Surgery | Admitting: Neurological Surgery

## 2011-04-20 DIAGNOSIS — M542 Cervicalgia: Secondary | ICD-10-CM

## 2011-04-23 ENCOUNTER — Emergency Department (HOSPITAL_COMMUNITY): Payer: Worker's Compensation

## 2011-04-23 ENCOUNTER — Emergency Department (HOSPITAL_COMMUNITY)
Admission: EM | Admit: 2011-04-23 | Discharge: 2011-04-23 | Disposition: A | Discharge status: Home / Self Care | Payer: Worker's Compensation | Attending: Emergency Medicine | Admitting: Emergency Medicine

## 2011-04-23 DIAGNOSIS — R079 Chest pain, unspecified: Secondary | ICD-10-CM | POA: Insufficient documentation

## 2011-04-23 DIAGNOSIS — S335XXA Sprain of ligaments of lumbar spine, initial encounter: Secondary | ICD-10-CM | POA: Insufficient documentation

## 2011-04-23 DIAGNOSIS — M542 Cervicalgia: Secondary | ICD-10-CM | POA: Insufficient documentation

## 2011-04-23 DIAGNOSIS — S39012A Strain of muscle, fascia and tendon of lower back, initial encounter: Secondary | ICD-10-CM

## 2011-04-23 DIAGNOSIS — Y990 Civilian activity done for income or pay: Secondary | ICD-10-CM | POA: Insufficient documentation

## 2011-04-23 DIAGNOSIS — Y9269 Other specified industrial and construction area as the place of occurrence of the external cause: Secondary | ICD-10-CM | POA: Insufficient documentation

## 2011-04-23 DIAGNOSIS — Z981 Arthrodesis status: Secondary | ICD-10-CM | POA: Insufficient documentation

## 2011-04-23 DIAGNOSIS — W208XXA Other cause of strike by thrown, projected or falling object, initial encounter: Secondary | ICD-10-CM | POA: Insufficient documentation

## 2011-04-23 DIAGNOSIS — M5137 Other intervertebral disc degeneration, lumbosacral region: Secondary | ICD-10-CM | POA: Insufficient documentation

## 2011-04-23 DIAGNOSIS — M51379 Other intervertebral disc degeneration, lumbosacral region without mention of lumbar back pain or lower extremity pain: Secondary | ICD-10-CM | POA: Insufficient documentation

## 2011-04-23 MED ORDER — CYCLOBENZAPRINE HCL 10 MG PO TABS: 10.0000 mg | ORAL_TABLET | Freq: Two times a day (BID) | ORAL | Status: AC | PRN

## 2011-04-23 MED ORDER — OXYCODONE-ACETAMINOPHEN 5-325 MG PO TABS
1.0000 | ORAL_TABLET | Freq: Once | ORAL | Status: AC
  Administered 2011-04-23: 1 via ORAL
  Filled 2011-04-23: qty 1

## 2011-04-23 NOTE — ED Provider Notes (Signed)
History     CSN: [id number]  Arrival date & time 04/23/11  1514   First MD Initiated Contact with Patient 04/23/11 1525      Chief Complaint  Patient presents with  . Back Pain    A computer fell on pt's chest and left wrist. C/O primarily of lower back pain,    (Consider location/radiation/quality/duration/timing/severity/associated sxs/prior treatment) Patient is a 41 y.o. female presenting with back pain. The history is provided by the patient.  Back Pain  This is a new problem. The current episode started less than 1 hour ago. The problem occurs constantly. The problem has not changed since onset.Associated with: A computer desk apparatus fell on her at work today pending her against her chair. The pain is present in the lumbar spine. The quality of the pain is described as stabbing and aching. The pain does not radiate. The pain is at a severity of 8/10. The pain is moderate. The symptoms are aggravated by bending, twisting and certain positions. The pain is the same all the time. Associated symptoms include chest pain. Pertinent negatives include no numbness, no headaches, no abdominal pain, no bowel incontinence, no bladder incontinence, no paresthesias and no weakness. Associated symptoms comments: The apparatus also fell against her upper chest. She has tried nothing for the symptoms. The treatment provided no relief. Risk factors: Prior history of cervical fusion September of last year.    No past medical history on file.  No past surgical history on file.  No family history on file.  History  Substance Use Topics  . Smoking status: Not on file  . Smokeless tobacco: Not on file  . Alcohol Use: Not on file    OB History    No data available      Review of Systems  Cardiovascular: Positive for chest pain.  Gastrointestinal: Negative for abdominal pain and bowel incontinence.  Genitourinary: Negative for bladder incontinence.  Musculoskeletal: Positive for back pain.   Neurological: Negative for weakness, numbness, headaches and paresthesias.  All other systems reviewed and are negative.    Allergies  Review of patient's allergies indicates no known allergies.  Home Medications   Current Outpatient Rx  Name Route Sig Dispense Refill  . OXYMETAZOLINE HCL 0.05 % NA SOLN Nasal Place 2 sprays into the nose as needed. For congestion.    Marland Kitchen OXYMORPHONE HCL ER 20 MG PO TB12 Oral Take 20 mg by mouth 2 (two) times daily.     Marland Kitchen OXYMORPHONE HCL 10 MG PO TABS Oral Take 10 mg by mouth every 6 (six) hours as needed. For breakthrough pain.      BP 133/82  Pulse 90  Temp(Src) 98.8 F (37.1 C) (Oral)  Resp 18  SpO2 100%  Physical Exam  Nursing note and vitals reviewed. Constitutional: She is oriented to person, place, and time. She appears well-developed and well-nourished. No distress.  HENT:  Head: Normocephalic and atraumatic.  Mouth/Throat: Oropharynx is clear and moist.  Eyes: EOM are normal. Pupils are equal, round, and reactive to light.  Neck: Spinous process tenderness present. No muscular tenderness present. Normal range of motion present.  Cardiovascular: Normal rate, regular rhythm, normal heart sounds and intact distal pulses.  Exam reveals no friction rub.   No murmur heard. Pulmonary/Chest: Effort normal and breath sounds normal. She has no wheezes. She has no rales. She exhibits tenderness. She exhibits no crepitus.    Abdominal: Soft. Bowel sounds are normal. She exhibits no distension. There is no tenderness.  There is no rebound and no guarding.  Musculoskeletal: She exhibits no tenderness.       Lumbar back: She exhibits decreased range of motion, tenderness, bony tenderness and pain. She exhibits no deformity and normal pulse.       No edema  Neurological: She is alert and oriented to person, place, and time. She has normal strength. No cranial nerve deficit or sensory deficit.  Skin: Skin is warm and dry. No rash noted.  Psychiatric:  She has a normal mood and affect. Her behavior is normal.    ED Course  Procedures (including critical care time)  Labs Reviewed - No data to display Dg Chest 2 View  04/23/2011  *RADIOLOGY REPORT*  Clinical Data: Pain, trauma  CHEST - 2 VIEW  Comparison: November 27, 2010  Findings: The cardiac silhouette, mediastinum, pulmonary vasculature are within normal limits.  Both lungs are clear. There is no acute bony abnormality.  IMPRESSION: There is no evidence of acute cardiac or pulmonary process.  Original Report Authenticated By: Brandon Melnick, M.D.   Dg Cervical Spine Complete  04/23/2011  *RADIOLOGY REPORT*  Clinical Data: Larey Seat.  Neck pain.  History of previous fusion.  CERVICAL SPINE - COMPLETE 4+ VIEW  Comparison: 04/20/2011.  Findings: Stable anterior fusion hardware at C3-4.  No solid interbody fusion is demonstrated.  Interbody fusion changes are noted at C5-6.  The alignment is normal.  No acute fracture.  The facets are normally aligned.  The neural foramen are patent.  The C1-2 articulations are maintained.  The lung apices are clear.  IMPRESSION:  1.  Normal alignment and no acute bony findings. 2.  Stable fusion hardware at C4-5.  No solid interbody fusion is demonstrated. 3.  Stable interbody fusion at C5-6.  Original Report Authenticated By: P. Loralie Champagne, M.D.   Dg Lumbar Spine Complete  04/23/2011  *RADIOLOGY REPORT*  Clinical Data: 41 year old female status post blunt trauma with pain.  LUMBAR SPINE - COMPLETE 4+ VIEW  Comparison: Lumbar MRI 10/29/2010 and earlier.  Findings: Normal lumbar segmentation.  This is the same numbering system as on the comparison MRI.  Stable L5-S1 disc space narrowing and mild retrolisthesis.  Stable vertebral height and alignment and relatively preserved disc spaces elsewhere.  No pars fracture.  SI joints within normal limits.  Visualized sacrum appears stable. Small pelvic phleboliths.  IMPRESSION: No acute osseous abnormality in the lumbar  spine.  Chronic L5-S1 disc degeneration.  Original Report Authenticated By: Harley Hallmark, M.D.     No diagnosis found.    MDM   Patient was work today and her desk computer or apparatus fell onto her pinning her against her chair against the wall. In September she had a fusion to her cervical spine which is mildly tender on exam she has moderate lumbar pain. She has a history of low back pain but states it's much worse now. Also she has some pain in her chest where the desk hit her.  She did not hit her head and did not have LOC. Plain films of the chest ,C. and L. spine pending.  4:35 PM Plain films within normal limits and patient discharged home       Gwyneth Sprout, MD 04/23/11 1635

## 2011-04-23 NOTE — ED Notes (Signed)
Could not get the esignature to work so pt did not sign she did get her instructions and verbalized an understanding

## 2011-04-23 NOTE — ED Notes (Signed)
ZOX:[id number]<AV> Expected date:04/23/11<BR> Expected time: 2:47 PM<BR> Means of arrival:Ambulance<BR> Comments:<BR> EMS 80 GC, 48 yof computer fell on pt , chest, wrist and lower back pain

## 2011-04-23 NOTE — Discharge Instructions (Signed)
Back Pain, Adult Low back pain is very common. About 1 in 5 people have back pain.The cause of low back pain is rarely dangerous. The pain often gets better over time.About half of people with a sudden onset of back pain feel better in just 2 weeks. About 8 in 10 people feel better by 6 weeks.  CAUSES Some common causes of back pain include:  Strain of the muscles or ligaments supporting the spine.   Wear and tear (degeneration) of the spinal discs.   Arthritis.   Direct injury to the back.  DIAGNOSIS Most of the time, the direct cause of low back pain is not known.However, back pain can be treated effectively even when the exact cause of the pain is unknown.Answering your caregiver's questions about your overall health and symptoms is one of the most accurate ways to make sure the cause of your pain is not dangerous. If your caregiver needs more information, he or she may order lab work or imaging tests (X-rays or MRIs).However, even if imaging tests show changes in your back, this usually does not require surgery. HOME CARE INSTRUCTIONS For many people, back pain returns.Since low back pain is rarely dangerous, it is often a condition that people can learn to manageon their own.   Remain active. It is stressful on the back to sit or stand in one place. Do not sit, drive, or stand in one place for more than 30 minutes at a time. Take short walks on level surfaces as soon as pain allows.Try to increase the length of time you walk each day.   Do not stay in bed.Resting more than 1 or 2 days can delay your recovery.   Do not avoid exercise or work.Your body is made to move.It is not dangerous to be active, even though your back may hurt.Your back will likely heal faster if you return to being active before your pain is gone.   Pay attention to your body when you bend and lift. Many people have less discomfortwhen lifting if they bend their knees, keep the load close to their  bodies,and avoid twisting. Often, the most comfortable positions are those that put less stress on your recovering back.   Find a comfortable position to sleep. Use a firm mattress and lie on your side with your knees slightly bent. If you lie on your back, put a pillow under your knees.   Only take over-the-counter or prescription medicines as directed by your caregiver. Over-the-counter medicines to reduce pain and inflammation are often the most helpful.Your caregiver may prescribe muscle relaxant drugs.These medicines help dull your pain so you can more quickly return to your normal activities and healthy exercise.   Put ice on the injured area.   Put ice in a plastic bag.   Place a towel between your skin and the bag.   Leave the ice on for 15 to 20 minutes, 3 to 4 times a day for the first 2 to 3 days. After that, ice and heat may be alternated to reduce pain and spasms.   Ask your caregiver about trying back exercises and gentle massage. This may be of some benefit.   Avoid feeling anxious or stressed.Stress increases muscle tension and can worsen back pain.It is important to recognize when you are anxious or stressed and learn ways to manage it.Exercise is a great option.  SEEK MEDICAL CARE IF:  You have pain that is not relieved with rest or medicine.   You have   pain that does not improve in 1 week.   You have new symptoms.   You are generally not feeling well.  SEEK IMMEDIATE MEDICAL CARE IF:   You have pain that radiates from your back into your legs.   You develop new bowel or bladder control problems.   You have unusual weakness or numbness in your arms or legs.   You develop nausea or vomiting.   You develop abdominal pain.   You feel faint.  Document Released: 02/23/2005 Document Revised: 11/05/2010 Document Reviewed: 07/14/2010 [employer] Patient Information 2012 [employer]. 

## 2011-06-01 ENCOUNTER — Other Ambulatory Visit: Payer: Self-pay | Admitting: Neurological Surgery

## 2011-06-01 DIAGNOSIS — M542 Cervicalgia: Secondary | ICD-10-CM

## 2011-06-15 ENCOUNTER — Ambulatory Visit
Admission: RE | Admit: 2011-06-15 | Discharge: 2011-06-15 | Disposition: A | Discharge status: Home / Self Care | Payer: 59 | Source: Ambulatory Visit | Attending: Neurological Surgery | Admitting: Neurological Surgery

## 2011-06-15 DIAGNOSIS — M542 Cervicalgia: Secondary | ICD-10-CM

## 2011-09-23 ENCOUNTER — Emergency Department (HOSPITAL_COMMUNITY)
Admission: EM | Admit: 2011-09-23 | Discharge: 2011-09-23 | Disposition: A | Discharge status: Home / Self Care | Payer: 59 | Attending: Emergency Medicine | Admitting: Emergency Medicine

## 2011-09-23 ENCOUNTER — Emergency Department (HOSPITAL_COMMUNITY): Payer: 59

## 2011-09-23 ENCOUNTER — Encounter (HOSPITAL_COMMUNITY): Payer: Self-pay | Admitting: *Deleted

## 2011-09-23 DIAGNOSIS — M545 Low back pain, unspecified: Secondary | ICD-10-CM | POA: Insufficient documentation

## 2011-09-23 DIAGNOSIS — M549 Dorsalgia, unspecified: Secondary | ICD-10-CM

## 2011-09-23 MED ORDER — DICLOFENAC SODIUM 75 MG PO TBEC: 75.0000 mg | DELAYED_RELEASE_TABLET | Freq: Two times a day (BID) | ORAL | Status: DC

## 2011-09-23 MED ORDER — DEXAMETHASONE SODIUM PHOSPHATE 10 MG/ML IJ SOLN
10.0000 mg | Freq: Once | INTRAMUSCULAR | Status: AC
  Administered 2011-09-23: 10 mg via INTRAMUSCULAR
  Filled 2011-09-23: qty 1

## 2011-09-23 MED ORDER — KETOROLAC TROMETHAMINE 60 MG/2ML IM SOLN
60.0000 mg | Freq: Once | INTRAMUSCULAR | Status: AC
  Administered 2011-09-23: 60 mg via INTRAMUSCULAR
  Filled 2011-09-23: qty 2

## 2011-09-23 MED ORDER — METHOCARBAMOL 500 MG PO TABS: 500.0000 mg | ORAL_TABLET | Freq: Two times a day (BID) | ORAL | Status: AC

## 2011-09-23 NOTE — ED Provider Notes (Signed)
History     CSN: [id number]  Arrival date & time 09/23/11  0941   First MD Initiated Contact with Patient 09/23/11 1044      11:10 AM HPI Patient reports this morning going to the restroom when she tripped and fell directly onto her coccyx. Reports since then has had low back pain with radiation of pain down right lower extremity. Describes pain as sharp. Reports a history of back and neck surgery done by Dr. Yetta Barre. Reports no improvement of pain despite using chronic pain medication for back pain. Denies incontinence, saddle anesthesias, perineal numbness, abdominal pain, nausea, vomiting. Patient is a 41 y.o. female presenting with back pain. The history is provided by the patient.  Back Pain  This is a new problem. The current episode started 6 to 12 hours ago. The problem occurs constantly. The problem has not changed since onset.The pain is associated with falling. The pain is present in the lumbar spine. The quality of the pain is described as shooting. The pain radiates to the right thigh. The pain is severe. Associated symptoms include leg pain. Pertinent negatives include no chest pain, no fever, no numbness, no headaches, no abdominal pain, no bowel incontinence, no perianal numbness, no bladder incontinence, no dysuria, no pelvic pain, no paresthesias, no paresis, no tingling and no weakness. She has tried analgesics for the symptoms. The treatment provided no relief.    History reviewed. No pertinent past medical history.  Past Surgical History  Procedure Date  . Neck surgery   . Back surgery   . Abdominal hysterectomy     No family history on file.  History  Substance Use Topics  . Smoking status: Never Smoker   . Smokeless tobacco: Not on file  . Alcohol Use: Yes    OB History    Grav Para Term Preterm Abortions TAB SAB Ect Mult Living                  Review of Systems  Constitutional: Negative for fever and chills.  HENT: Negative for neck pain and neck  stiffness.   Cardiovascular: Negative for chest pain.  Gastrointestinal: Negative for abdominal pain and bowel incontinence.  Genitourinary: Negative for bladder incontinence, dysuria, urgency, frequency, hematuria, flank pain and pelvic pain.  Musculoskeletal: Positive for back pain.       Denies saddle anesthesias, or perineal numbness, urinary or bowel incontinence  Neurological: Negative for tingling, weakness, numbness, headaches and paresthesias.  All other systems reviewed and are negative.    Allergies  Review of patient's allergies indicates no known allergies.  Home Medications   Current Outpatient Rx  Name Route Sig Dispense Refill  . OXYMETAZOLINE HCL 0.05 % NA SOLN Nasal Place 2 sprays into the nose as needed. For congestion.    Marland Kitchen OXYMORPHONE HCL ER 20 MG PO TB12 Oral Take 20 mg by mouth 2 (two) times daily.     Marland Kitchen OXYMORPHONE HCL 10 MG PO TABS Oral Take 10 mg by mouth every 6 (six) hours as needed. For breakthrough pain.      BP 134/107  Pulse 107  Temp 98.4 F (36.9 C) (Oral)  Resp 16  Ht 5' 3.5" (1.613 m)  Wt 150 lb (68.04 kg)  BMI 26.15 kg/m2  SpO2 99%  Physical Exam  Vitals reviewed. Constitutional: She is oriented to person, place, and time. Vital signs are normal. She appears well-developed and well-nourished.  HENT:  Head: Normocephalic and atraumatic.  Eyes: Conjunctivae are normal. Pupils are  equal, round, and reactive to light.  Neck: Normal range of motion. Neck supple.  Cardiovascular: Normal rate, regular rhythm and normal heart sounds.  Exam reveals no friction rub.   No murmur heard. Pulmonary/Chest: Effort normal and breath sounds normal. She has no wheezes. She has no rhonchi. She has no rales. She exhibits no tenderness.  Musculoskeletal: Normal range of motion.       Lumbar back: She exhibits tenderness, bony tenderness, pain and spasm. She exhibits normal range of motion and normal pulse.       Patient has normal distal pulses and  sensation distally of bilateral lower chimneys. Tenderness reproducible with percussion of lumbar spine and paraspinal region. Positive straight leg raise. No deformities.  Neurological: She is alert and oriented to person, place, and time. Coordination normal.  Skin: Skin is warm and dry. No rash noted. No erythema. No pallor.    ED Course  Procedures  Results for orders placed during the hospital encounter of 12/05/10  BASIC METABOLIC PANEL      Component Value Range   Sodium 137  135 - 145 mEq/L   Potassium 3.5  3.5 - 5.1 mEq/L   Chloride 100  96 - 112 mEq/L   CO2 29  19 - 32 mEq/L   Glucose, Bld 81  70 - 99 mg/dL   BUN 6  6 - 23 mg/dL   Creatinine, Ser 1.61  0.50 - 1.10 mg/dL   Calcium 9.6  8.4 - 09.6 mg/dL   GFR calc non Af Amer >60  >60 mL/min   GFR calc Af Amer >60  >60 mL/min   Dg Lumbar Spine Complete  09/23/2011  *RADIOLOGY REPORT*  Clinical Data: 41 year old female status post fall with pain radiating to the right lower extremity.  LUMBAR SPINE - COMPLETE 4+ VIEW  Comparison: 04/23/2011.  Findings: Normal lumbar segmentation. Bone mineralization is within normal limits.  Stable and normal lumbar vertebral height and alignment.  Trace retrolisthesis of L5 on S1 is stable lung with some disc space loss.  No pars fracture.  The sacral ala and SI joints appear stable and within normal limits.  IMPRESSION: No acute fracture or listhesis identified in the lumbar spine.  Original Report Authenticated By: Harley Hallmark, M.D.     MDM   Patient has narcotic pain medication. Advised anti-inflammatory medication muscle relaxants. Patient also is neurosurgeon. Advised followup with Dr. Yetta Barre if pain continues patient voices understanding and is ready for discharge. Temperature on x-ray. Low suspicion for cauda equina syndrome. No urinary symptoms.      Thomasene Lot, PA-C 09/23/11 1207

## 2011-09-23 NOTE — ED Notes (Signed)
Patient reports she fell today at 0300.  She states she was getting up and tripped this morning.  She has increased neck and back pain post fall.  Patient has hx of back and neck surgery.

## 2011-09-28 NOTE — ED Provider Notes (Signed)
Medical screening examination/treatment/procedure(s) were performed by non-physician practitioner and as supervising physician I was immediately available for consultation/collaboration.    Nadia Torr L Kathline Banbury, MD 09/28/11 0357 

## 2011-12-20 ENCOUNTER — Encounter (HOSPITAL_COMMUNITY): Payer: Self-pay | Admitting: *Deleted

## 2011-12-20 ENCOUNTER — Other Ambulatory Visit: Payer: Self-pay

## 2011-12-20 ENCOUNTER — Emergency Department (HOSPITAL_COMMUNITY): Payer: 59

## 2011-12-20 ENCOUNTER — Emergency Department (HOSPITAL_COMMUNITY)
Admission: EM | Admit: 2011-12-20 | Discharge: 2011-12-21 | Disposition: A | Discharge status: Home / Self Care | Payer: 59 | Attending: Emergency Medicine | Admitting: Emergency Medicine

## 2011-12-20 DIAGNOSIS — R0789 Other chest pain: Secondary | ICD-10-CM

## 2011-12-20 DIAGNOSIS — R071 Chest pain on breathing: Secondary | ICD-10-CM | POA: Insufficient documentation

## 2011-12-20 DIAGNOSIS — R110 Nausea: Secondary | ICD-10-CM | POA: Insufficient documentation

## 2011-12-20 DIAGNOSIS — E876 Hypokalemia: Secondary | ICD-10-CM | POA: Insufficient documentation

## 2011-12-20 DIAGNOSIS — R0602 Shortness of breath: Secondary | ICD-10-CM | POA: Insufficient documentation

## 2011-12-20 HISTORY — DX: Hyperlipidemia, unspecified: E78.5

## 2011-12-20 LAB — TROPONIN I: Troponin I: <0.3 ng/mL (ref <0.30)

## 2011-12-20 LAB — BASIC METABOLIC PANEL
CO2: 26 mEq/L (ref 19–32)
Calcium: 9.5 mg/dL (ref 8.4–10.5)
Chloride: 101 mEq/L (ref 96–112)
GFR calc Af Amer: 88 mL/min — ABNORMAL LOW (ref >90)
Sodium: 137 mEq/L (ref 135–145)

## 2011-12-20 LAB — CBC WITH DIFFERENTIAL/PLATELET
Basophils Absolute: 0 10*3/uL (ref 0.0–0.1)
Basophils Relative: 0 % (ref 0–1)
Eosinophils Relative: 0 % (ref 0–5)
Lymphocytes Relative: 51 % — ABNORMAL HIGH (ref 12–46)
MCHC: 34.1 g/dL (ref 30.0–36.0)
Neutro Abs: 2.4 10*3/uL (ref 1.7–7.7)
Platelets: 269 10*3/uL (ref 150–400)
RDW: 13.4 % (ref 11.5–15.5)
WBC: 5.6 10*3/uL (ref 4.0–10.5)

## 2011-12-20 MED ORDER — IBUPROFEN 800 MG PO TABS
800.0000 mg | ORAL_TABLET | Freq: Once | ORAL | Status: AC
  Administered 2011-12-20: 800 mg via ORAL
  Filled 2011-12-20: qty 1

## 2011-12-20 MED ORDER — HYDROCODONE-ACETAMINOPHEN 5-325 MG PO TABS
2.0000 | ORAL_TABLET | Freq: Once | ORAL | Status: AC
  Administered 2011-12-20: 2 via ORAL
  Filled 2011-12-20: qty 2

## 2011-12-20 NOTE — ED Provider Notes (Signed)
History  This chart was scribed for [employer]. Colon Branch, MD by Erskine Emery. This patient was seen in room APA01/APA01 and the patient's care was started at 23:05.   CSN: [id number]  Arrival date & time 12/20/11  2149   First MD Initiated Contact with Patient 12/20/11 2305      Chief Complaint  Patient presents with  . Chest Pain    (Consider location/radiation/quality/duration/timing/severity/associated sxs/prior treatment) The history is provided by the patient. No language interpreter was used.  Mariah Anderson is a 41 y.o. female brought in by ambulance, who presents to the Emergency Department complaining of gradually worsening intermittent sharp chest wall pain since earlier today. Pt reports deep breathing aggravates the pain. Pt reports some associated SOB, mild cough, and some nausea last week. Pt denies knowing of anything she did that could have caused the pain and claims she has otherwise been perfectly healthy.  PCP Dr. Parke Simmers  Past Medical History  Diagnosis Date  . Hyperlipidemia     Past Surgical History  Procedure Date  . Neck surgery   . Back surgery   . Abdominal hysterectomy     History reviewed. No pertinent family history.  History  Substance Use Topics  . Smoking status: Never Smoker   . Smokeless tobacco: Not on file  . Alcohol Use: Yes    OB History    Grav Para Term Preterm Abortions TAB SAB Ect Mult Living                  Review of Systems  Constitutional: Negative for fever.       10 Systems reviewed and are negative for acute change except as noted in the HPI.  HENT: Negative for congestion.   Eyes: Negative for discharge and redness.  Respiratory: Positive for chest tightness. Negative for cough and shortness of breath.   Cardiovascular: Negative for chest pain.  Gastrointestinal: Positive for nausea. Negative for vomiting and abdominal pain.  Musculoskeletal: Negative for back pain.  Skin: Negative for rash.  Neurological: Negative  for syncope, numbness and headaches.  Psychiatric/Behavioral:       No behavior change.     Allergies  Review of patient's allergies indicates no known allergies.  Home Medications   Current Outpatient Rx  Name Route Sig Dispense Refill  . DICLOFENAC SODIUM 75 MG PO TBEC Oral Take 1 tablet (75 mg total) by mouth 2 (two) times daily. 30 tablet 0  . OXYMORPHONE HCL ER 20 MG PO TB12 Oral Take 20 mg by mouth 2 (two) times daily.     Marland Kitchen OXYMORPHONE HCL 10 MG PO TABS Oral Take 10 mg by mouth every 6 (six) hours as needed. For breakthrough pain.    Marland Kitchen OXYMETAZOLINE HCL 0.05 % NA SOLN Nasal Place 2 sprays into the nose as needed. For congestion.      BP 129/90  Pulse 88  Resp 22  SpO2 100%  Physical Exam  Nursing note and vitals reviewed. Constitutional: She is oriented to person, place, and time. She appears well-developed and well-nourished. No distress.  HENT:  Head: Normocephalic and atraumatic.  Eyes: EOM are normal. Pupils are equal, round, and reactive to light.  Neck: Neck supple. No tracheal deviation present.  Cardiovascular: Normal rate.   Pulmonary/Chest: Effort normal. No respiratory distress.       No reproducible pain upon palpation.  Abdominal: Soft. She exhibits no distension.  Musculoskeletal: Normal range of motion. She exhibits no edema.  Neurological: She is alert  and oriented to person, place, and time.  Skin: Skin is warm and dry.  Psychiatric: She has a normal mood and affect.    ED Course  Procedures (including critical care time) DIAGNOSTIC STUDIES: Oxygen Saturation is 100% on room air, normal by my interpretation.    COORDINATION OF CARE: 23:25--I evaluated the patient and we discussed a treatment plan including antiinflammatories and pain medication to which the pt agreed.  0030 Patient states pain has improved and nausea has improved.   Results for orders placed during the hospital encounter of 12/20/11  CBC WITH DIFFERENTIAL      Component  Value Range   WBC 5.6  4.0 - 10.5 K/uL   RBC 4.04  3.87 - 5.11 MIL/uL   Hemoglobin 12.2  12.0 - 15.0 g/dL   HCT 16.1 (*) 09.6 - 04.5 %   MCV 88.6  78.0 - 100.0 fL   MCH 30.2  26.0 - 34.0 pg   MCHC 34.1  30.0 - 36.0 g/dL   RDW 40.9  81.1 - 91.4 %   Platelets 269  150 - 400 K/uL   Neutrophils Relative 43  43 - 77 %   Neutro Abs 2.4  1.7 - 7.7 K/uL   Lymphocytes Relative 51 (*) 12 - 46 %   Lymphs Abs 2.8  0.7 - 4.0 K/uL   Monocytes Relative 7  3 - 12 %   Monocytes Absolute 0.4  0.1 - 1.0 K/uL   Eosinophils Relative 0  0 - 5 %   Eosinophils Absolute 0.0  0.0 - 0.7 K/uL   Basophils Relative 0  0 - 1 %   Basophils Absolute 0.0  0.0 - 0.1 K/uL  BASIC METABOLIC PANEL      Component Value Range   Sodium 137  135 - 145 mEq/L   Potassium 3.2 (*) 3.5 - 5.1 mEq/L   Chloride 101  96 - 112 mEq/L   CO2 26  19 - 32 mEq/L   Glucose, Bld 104 (*) 70 - 99 mg/dL   BUN 7  6 - 23 mg/dL   Creatinine, Ser 7.82  0.50 - 1.10 mg/dL   Calcium 9.5  8.4 - 95.6 mg/dL   GFR calc non Af Amer 76 (*) >90 mL/min   GFR calc Af Amer 88 (*) >90 mL/min  TROPONIN I      Component Value Range   Troponin I <0.30  <0.30 ng/mL   Dg Chest Port 1 View  12/20/2011  *RADIOLOGY REPORT*  Clinical Data: Chest pain and shortness of breath.  PORTABLE CHEST - 1 VIEW  Comparison: 04/23/2011  Findings: The heart size and pulmonary vascularity are normal. The lungs appear clear and expanded without focal air space disease or consolidation. No blunting of the costophrenic angles.  Calcified granulomas in the lung bases.  No pneumothorax.  Mediastinal contours appear intact.  No significant change since previous study.  IMPRESSION: No evidence of active pulmonary disease.   Original Report Authenticated By: Marlon Pel, M.D.     Date: 12/20/2011   2152  Rate: 89  Rhythm: normal sinus rhythm  QRS Axis: normal  Intervals: normal  ST/T Wave abnormalities: normal  Conduction Disutrbances: none  Narrative Interpretation:  unremarkable     No diagnosis found.    MDM  Patient presents with chest wall pain that is worse with deep breathing. Unremarkable PE. Labs with slightly low potassium. EKG and chest xray normal. Given antiinflammatory and analgesic with improvement. Given potassium. Pt feels improved  after observation and/or treatment in ED.Pt stable in ED with no significant deterioration in condition.The patient appears reasonably screened and/or stabilized for discharge and I doubt any other medical condition or other [facility] requiring further screening, evaluation, or treatment in the ED at this time prior to discharge.  I personally performed the services described in this documentation, which was scribed in my presence. The recorded information has been reviewed and considered.   MDM Reviewed: nursing note and vitals Interpretation: labs, ECG and x-ray         Aurther Loft S. Colon Branch, MD 12/21/11 [phone]

## 2011-12-20 NOTE — ED Notes (Signed)
Pt arrived from home via ems. Pt c/o chest wall pain that increases with deep breath. Pt denies pain with palpation.

## 2011-12-21 MED ORDER — HYDROCODONE-ACETAMINOPHEN 5-325 MG PO TABS: 1.0000 | ORAL_TABLET | ORAL | Status: AC | PRN

## 2011-12-21 MED ORDER — POTASSIUM CHLORIDE CRYS ER 20 MEQ PO TBCR
60.0000 meq | EXTENDED_RELEASE_TABLET | Freq: Once | ORAL | Status: AC
  Administered 2011-12-21: 60 meq via ORAL
  Filled 2011-12-21: qty 3

## 2011-12-21 MED ORDER — IBUPROFEN 800 MG PO TABS: 800.0000 mg | ORAL_TABLET | Freq: Three times a day (TID) | ORAL | Status: DC

## 2011-12-21 NOTE — ED Notes (Signed)
Improved tho not painfree. Will f/u w/pmd this week.

## 2012-07-27 ENCOUNTER — Encounter (HOSPITAL_COMMUNITY): Payer: Self-pay

## 2012-07-27 ENCOUNTER — Emergency Department (HOSPITAL_COMMUNITY): Payer: 59

## 2012-07-27 ENCOUNTER — Emergency Department (HOSPITAL_COMMUNITY)
Admission: EM | Admit: 2012-07-27 | Discharge: 2012-07-27 | Disposition: A | Discharge status: Home / Self Care | Payer: 59 | Attending: Emergency Medicine | Admitting: Emergency Medicine

## 2012-07-27 DIAGNOSIS — Y9289 Other specified places as the place of occurrence of the external cause: Secondary | ICD-10-CM | POA: Insufficient documentation

## 2012-07-27 DIAGNOSIS — R209 Unspecified disturbances of skin sensation: Secondary | ICD-10-CM | POA: Insufficient documentation

## 2012-07-27 DIAGNOSIS — S3992XA Unspecified injury of lower back, initial encounter: Secondary | ICD-10-CM

## 2012-07-27 DIAGNOSIS — Z3202 Encounter for pregnancy test, result negative: Secondary | ICD-10-CM | POA: Insufficient documentation

## 2012-07-27 DIAGNOSIS — W1809XA Striking against other object with subsequent fall, initial encounter: Secondary | ICD-10-CM | POA: Insufficient documentation

## 2012-07-27 DIAGNOSIS — G8929 Other chronic pain: Secondary | ICD-10-CM | POA: Insufficient documentation

## 2012-07-27 DIAGNOSIS — Z79899 Other long term (current) drug therapy: Secondary | ICD-10-CM | POA: Insufficient documentation

## 2012-07-27 DIAGNOSIS — IMO0002 Reserved for concepts with insufficient information to code with codable children: Secondary | ICD-10-CM | POA: Insufficient documentation

## 2012-07-27 DIAGNOSIS — Z862 Personal history of diseases of the blood and blood-forming organs and certain disorders involving the immune mechanism: Secondary | ICD-10-CM | POA: Insufficient documentation

## 2012-07-27 DIAGNOSIS — Y9389 Activity, other specified: Secondary | ICD-10-CM | POA: Insufficient documentation

## 2012-07-27 DIAGNOSIS — Z8639 Personal history of other endocrine, nutritional and metabolic disease: Secondary | ICD-10-CM | POA: Insufficient documentation

## 2012-07-27 LAB — POCT PREGNANCY, URINE: Preg Test, Ur: NEGATIVE

## 2012-07-27 MED ORDER — PREDNISONE 50 MG PO TABS: 50.0000 mg | ORAL_TABLET | Freq: Every day | ORAL | Status: DC

## 2012-07-27 MED ORDER — HYDROMORPHONE HCL PF 1 MG/ML IJ SOLN
1.0000 mg | Freq: Once | INTRAMUSCULAR | Status: DC
  Administered 2012-07-27: 1 mg via INTRAVENOUS
  Filled 2012-07-27: qty 1

## 2012-07-27 MED ORDER — METHOCARBAMOL 500 MG PO TABS: 500.0000 mg | ORAL_TABLET | Freq: Two times a day (BID) | ORAL | Status: DC

## 2012-07-27 MED ORDER — HYDROMORPHONE HCL PF 1 MG/ML IJ SOLN: 1.0000 mg | Freq: Once | INTRAMUSCULAR | Status: DC

## 2012-07-27 MED ORDER — HYDROCODONE-ACETAMINOPHEN 5-325 MG PO TABS: 1.0000 | ORAL_TABLET | Freq: Four times a day (QID) | ORAL | Status: DC | PRN

## 2012-07-27 MED ORDER — HYDROMORPHONE HCL PF 2 MG/ML IJ SOLN: 2.0000 mg | Freq: Once | INTRAMUSCULAR | Status: DC

## 2012-07-27 MED ORDER — IBUPROFEN 400 MG PO TABS
600.0000 mg | ORAL_TABLET | Freq: Once | ORAL | Status: AC
  Administered 2012-07-27: 600 mg via ORAL
  Filled 2012-07-27: qty 1

## 2012-07-27 MED ORDER — PREDNISONE 20 MG PO TABS
60.0000 mg | ORAL_TABLET | Freq: Once | ORAL | Status: AC
  Administered 2012-07-27: 60 mg via ORAL
  Filled 2012-07-27: qty 3

## 2012-07-27 NOTE — ED Provider Notes (Signed)
History     CSN: [id number]  Arrival date & time 07/27/12  0102   First MD Initiated Contact with Patient 07/27/12 [phone]      Chief Complaint  Patient presents with  . Fall  . Back Pain    (Consider location/radiation/quality/duration/timing/severity/associated sxs/prior treatment) HPI Comments: Pt comes in with cc of fall and back pain. Hx of chronic lower back pain, and states that she had a fall about 10 days ago on to her back on a curb. She has been having worsening lower back pain, with tingling extending all the way to the right foot (typically, it is upto the knee). The pain is controlled with Opana typically. No associated numbness, urinary incontinence, urinary retention, bowel incontinence, saddle anesthesia. Pt does indicate that her leg has given out on her a couple of times.    Patient is a 42 y.o. female presenting with fall and back pain. The history is provided by the patient.  Fall Pertinent negatives include no chest pain, no abdominal pain, no headaches and no shortness of breath.  Back Pain Associated symptoms: no abdominal pain, no chest pain, no dysuria, no headaches and no numbness     Past Medical History  Diagnosis Date  . Hyperlipidemia     Past Surgical History  Procedure Laterality Date  . Neck surgery    . Abdominal hysterectomy    . Elbow surgery      No family history on file.  History  Substance Use Topics  . Smoking status: Never Smoker   . Smokeless tobacco: Not on file  . Alcohol Use: Yes     Comment: occasionally    OB History   Grav Para Term Preterm Abortions TAB SAB Ect Mult Living                  Review of Systems  Constitutional: Positive for activity change.  HENT: Negative for neck pain.   Respiratory: Negative for shortness of breath.   Cardiovascular: Negative for chest pain.  Gastrointestinal: Negative for nausea, vomiting and abdominal pain.  Genitourinary: Negative for dysuria.  Musculoskeletal: Positive  for back pain.  Neurological: Negative for numbness and headaches.    Allergies  Review of patient's allergies indicates no known allergies.  Home Medications   Current Outpatient Rx  Name  Route  Sig  Dispense  Refill  . desvenlafaxine (PRISTIQ) 100 MG 24 hr tablet   Oral   Take 100 mg by mouth daily.         Marland Kitchen oxymorphone (OPANA ER) 20 MG 12 hr tablet   Oral   Take 20 mg by mouth daily.          Marland Kitchen oxymorphone (OPANA) 10 MG tablet   Oral   Take 10 mg by mouth 2 (two) times daily as needed for pain (breakthrough pain). For breakthrough pain.           BP 138/89  Pulse 96  Temp(Src) 98.4 F (36.9 C) (Oral)  Resp 20  SpO2 98%  Physical Exam  Constitutional: She is oriented to person, place, and time. She appears well-developed and well-nourished.  HENT:  Head: Normocephalic and atraumatic.  Eyes: EOM are normal. Pupils are equal, round, and reactive to light.  Neck: Neck supple.  Cardiovascular: Normal rate, regular rhythm and normal heart sounds.   No murmur heard. Pulmonary/Chest: Effort normal. No respiratory distress.  Abdominal: Soft. She exhibits no distension. There is no tenderness. There is no rebound and no guarding.  Musculoskeletal:  Pt has tenderness over the lower lumbar region No step offs, no erythema. Pt has 1+ patellar reflex bilaterally. Able to discriminate between sharp and dull - but the symptoms are more prominent on the LLE compared to the right side. Able to ambulate  Neurological: She is alert and oriented to person, place, and time.  Skin: Skin is warm and dry.    ED Course  Procedures (including critical care time)  Labs Reviewed - No data to display No results found.   No diagnosis found.    MDM  DDx includes: - DJD of the back - Spondylitises/ spondylosis - Sciatica - Spinal cord compression - Conus medullaris - Epidural hematoma - Epidural abscess - Lytic/pathologic fracture - Myelitis - Musculoskeletal  pain  Pt comes in with cc of back pain. Pt has hx of back pain. No red flags on hx - except for the worsening tingling. The exam is non focal , with slightly weasker patellar pulse bilaterally - but equal- I didn't get rectal tone checked based on the the motor exam being pretty normal. Plan is to get Xrays. Plan is to start on steroids and have patient see her pcp/ back specialist.    Derwood Kaplan, MD 07/27/12 [phone]

## 2012-07-27 NOTE — ED Notes (Signed)
Pt d/c home in NAD. Pt instructed not to drive after taking narcotics. Pt sister to drive pt home. Pt voiced understanding of d.c instructions and follow up care.

## 2012-07-27 NOTE — ED Notes (Signed)
Patient transported to CT 

## 2012-07-27 NOTE — ED Notes (Signed)
Pt states she fell a week ago and landed on her back on a curb. And since has been having shooting pains down her right leg. Also reports she has been having more episodes of tripping and you this morning and tripped on her toes. Has pain down her left leg now too. Also has numbness in her last two digits on her right hand. Has been to Dr. Yetta Barre for neck surgery and elbow surgery and Dr. Murray Hodgkins.

## 2012-07-27 NOTE — ED Notes (Signed)
Dr. Nanavati at the bedside.  

## 2012-08-30 IMAGING — RF DG CERVICAL SPINE 1V
1 series · 1 of 1 positions shown · non-contrast
Comparison: MRI 10/29/2010.

CLINICAL DATA: 40-year-old female undergoing cervical spine
surgery.

DG SPINE PORTABLE - 1 VIEW

[Series 1: run · 1 of 1 slices shown]
[im 1/1]
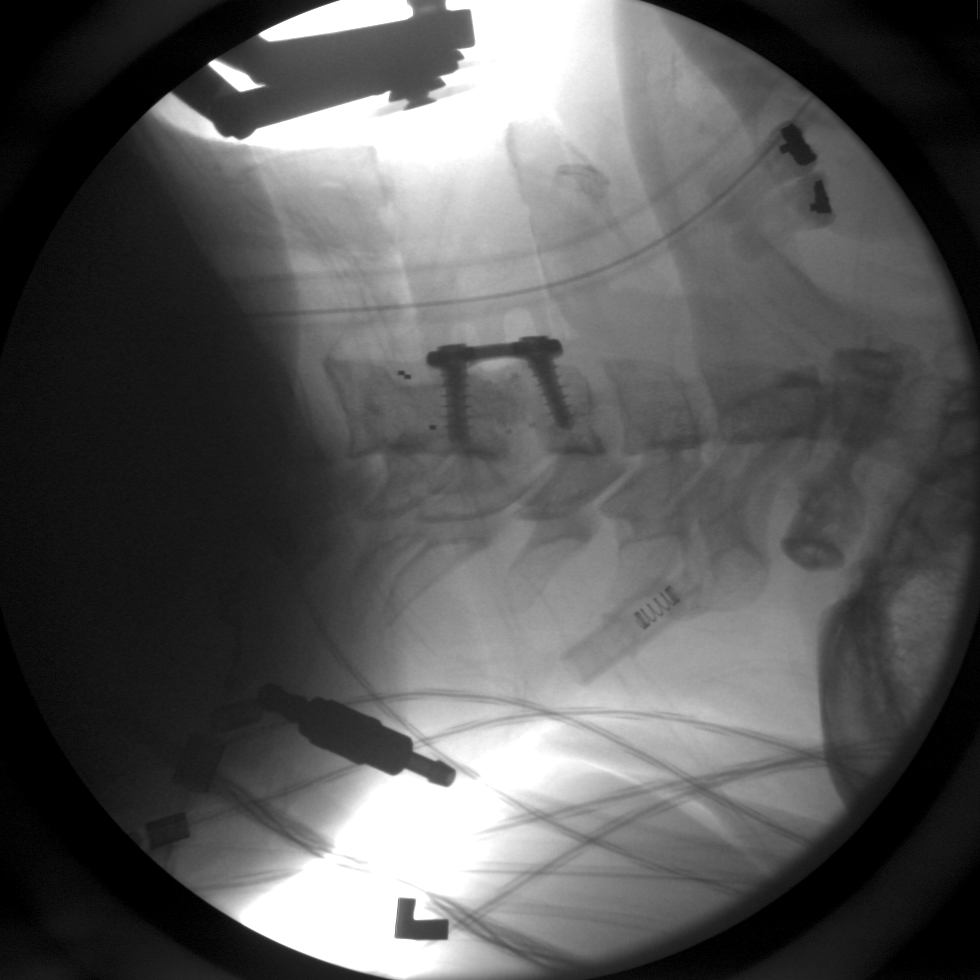

[1 of 1 positions shown; findings below may reference images not displayed]

FINDINGS: Intraoperative cross-table lateral fluoroscopic view the
cervical spine.  New ACDF hardware in place and C4-C5.  Sequelae of
ACDF at C5-C6.
IMPRESSION: ACDF extended cephalad to C4-C5.

## 2013-01-05 ENCOUNTER — Other Ambulatory Visit: Payer: Self-pay | Admitting: Neurological Surgery

## 2013-01-05 DIAGNOSIS — M5010 Cervical disc disorder with radiculopathy, unspecified cervical region: Secondary | ICD-10-CM

## 2013-01-10 ENCOUNTER — Ambulatory Visit
Admission: RE | Admit: 2013-01-10 | Discharge: 2013-01-10 | Disposition: A | Discharge status: Home / Self Care | Payer: 59 | Source: Ambulatory Visit | Attending: Neurological Surgery | Admitting: Neurological Surgery

## 2013-01-10 DIAGNOSIS — M5010 Cervical disc disorder with radiculopathy, unspecified cervical region: Secondary | ICD-10-CM

## 2013-04-28 ENCOUNTER — Other Ambulatory Visit: Payer: Self-pay | Admitting: Physical Medicine and Rehabilitation

## 2013-04-28 DIAGNOSIS — M5010 Cervical disc disorder with radiculopathy, unspecified cervical region: Secondary | ICD-10-CM

## 2013-04-28 DIAGNOSIS — M5416 Radiculopathy, lumbar region: Secondary | ICD-10-CM

## 2013-09-12 ENCOUNTER — Emergency Department (INDEPENDENT_AMBULATORY_CARE_PROVIDER_SITE_OTHER)
Admission: EM | Admit: 2013-09-12 | Discharge: 2013-09-12 | Disposition: A | Discharge status: Home / Self Care | Payer: 59 | Source: Home / Self Care | Attending: Family Medicine | Admitting: Family Medicine

## 2013-09-12 ENCOUNTER — Encounter (HOSPITAL_COMMUNITY): Payer: Self-pay | Admitting: Emergency Medicine

## 2013-09-12 DIAGNOSIS — G589 Mononeuropathy, unspecified: Secondary | ICD-10-CM

## 2013-09-12 DIAGNOSIS — G629 Polyneuropathy, unspecified: Secondary | ICD-10-CM

## 2013-09-12 MED ORDER — GABAPENTIN 300 MG PO CAPS: 300.0000 mg | ORAL_CAPSULE | Freq: Three times a day (TID) | ORAL | Status: DC | PRN

## 2013-09-12 NOTE — ED Notes (Signed)
See physicians note Pt c/o back pain; hx of chronic back pain and cervical surgery  Ambulated well to exam room w/NAD Alert w/no signs of acute distress.

## 2013-09-12 NOTE — Discharge Instructions (Signed)

## 2013-09-12 NOTE — ED Provider Notes (Signed)
CSN: [id number]     Arrival date & time 09/12/13  1522 History   First MD Initiated Contact with Patient 09/12/13 1550     Chief Complaint  Patient presents with  . Back Pain   (Consider location/radiation/quality/duration/timing/severity/associated sxs/prior Treatment) HPI Comments: 43 year old female presents complaining of chronic back pain and new onset numbness. 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 She currently is on a regimen of 10 mg oxymorphone 3 times a day for this by pain management. 2 weeks ago she started to have some numbness in the right side of her cheek and in her right arm. She says this is identical to symptoms she had previously when she started to have a bulging disc in her neck. She called her neurosurgeon to be seen for this problem and they told her to go to her primary care doctor. She could not be seen immediately so she resulted to coming here. Her symptoms wax and wane, para never constant for more than a few minutes at a time. No coldness or discoloration her arm. No difficulty chewing or swallowing, or talking. No problem with typing or grasping anything. She has no recent injury. No history of stroke. no recent surgery.  Patient is a 43 y.o. female presenting with back pain.  Back Pain   Past Medical History  Diagnosis Date  . Hyperlipidemia    Past Surgical History  Procedure Laterality Date  . Neck surgery    . Abdominal hysterectomy    . Elbow surgery     No family history on file. History  Substance Use Topics  . Smoking status: Never Smoker   . Smokeless tobacco: Not on file  . Alcohol Use: Yes     Comment: occasionally   OB History   Grav Para Term Preterm Abortions TAB SAB Ect Mult Living                 Review of Systems  Musculoskeletal: Positive for back pain.    Allergies  Review of patient's allergies indicates no known allergies.  Home Medications   Prior to Admission medications   Medication Sig  Start Date End Date Taking? Authorizing Provider  oxymorphone (OPANA) 10 MG tablet Take 10 mg by mouth 2 (two) times daily as needed for pain (breakthrough pain). For breakthrough pain.   Yes Historical Provider, MD  desvenlafaxine (PRISTIQ) 100 MG 24 hr tablet Take 100 mg by mouth daily.    Historical Provider, MD  gabapentin (NEURONTIN) 300 MG capsule Take 1 capsule (300 mg total) by mouth 3 (three) times daily as needed. 09/12/13   Graylon GoodZachary H Tanika Bracco, PA-C  HYDROcodone-acetaminophen (NORCO/VICODIN) 5-325 MG per tablet Take 1 tablet by mouth every 6 (six) hours as needed for pain. 07/27/12   Derwood KaplanAnkit Nanavati, MD  methocarbamol (ROBAXIN) 500 MG tablet Take 1 tablet (500 mg total) by mouth 2 (two) times daily. 07/27/12   Derwood KaplanAnkit Nanavati, MD  oxymorphone (OPANA ER) 20 MG 12 hr tablet Take 20 mg by mouth daily.     Historical Provider, MD  predniSONE (DELTASONE) 50 MG tablet Take 1 tablet (50 mg total) by mouth daily. 07/27/12   Ankit Rhunette CroftNanavati, MD   BP 138/90  Pulse 100  Temp(Src) 98.5 F (36.9 C) (Oral)  Resp 16  Ht 5\' 3"  (1.6 m)  Wt 150 lb (68.04 kg)  BMI 26.58 kg/m2  SpO2 100% Physical Exam  Nursing note and vitals reviewed. Constitutional: She is oriented to person, place, and  time. Vital signs are normal. She appears well-developed and well-nourished. No distress.  HENT:  Head: Normocephalic and atraumatic.  No decreased sensation on the face   Cardiovascular:  Pulses:      Carotid pulses are 2+ on the right side, and 2+ on the left side.      Radial pulses are 2+ on the right side, and 2+ on the left side.  Pulmonary/Chest: Effort normal. No respiratory distress.  Musculoskeletal:       Right shoulder: Normal.       Left shoulder: Normal.       Cervical back: Normal.       Right hand: She exhibits normal two-point discrimination and normal capillary refill. Normal sensation noted. Normal strength noted.       Left hand: She exhibits normal capillary refill. Normal sensation noted. Normal  strength noted.  Neurological: She is alert and oriented to person, place, and time. She has normal strength. No cranial nerve deficit. Coordination normal.  Skin: Skin is warm and dry. No rash noted. She is not diaphoretic.  Psychiatric: She has a normal mood and affect. Judgment normal.    ED Course  Procedures (including critical care time) Labs Review Labs Reviewed - No data to display  Imaging Review No results found.   MDM   1. Neuropathy    Followup with pain management for chronic back pain problem Physical exam is normal, neurologic exam is normal, followup with neurology for evaluation of neuropathy. Will attempt treatment with gabapentin.  Meds ordered this encounter  Medications  . gabapentin (NEURONTIN) 300 MG capsule    Sig: Take 1 capsule (300 mg total) by mouth 3 (three) times daily as needed.    Dispense:  30 capsule    Refill:  0    Order Specific Question:  Supervising Provider    Answer:  Bradd CanaryKINDL, JAMES D [[id number]]     Graylon GoodZachary H Azoria Abbett, PA-C 09/14/13 1108

## 2013-09-13 ENCOUNTER — Other Ambulatory Visit: Payer: Self-pay

## 2013-09-14 NOTE — ED Provider Notes (Signed)
Medical screening examination/treatment/procedure(s) were performed by non-physician practitioner and as supervising physician I was immediately available for consultation/collaboration.  Leslee Homeavid Ellarie Picking, M.D.  Reuben Likesavid C Charline Hoskinson, MD 09/14/13 1115

## 2013-11-17 ENCOUNTER — Other Ambulatory Visit: Payer: Self-pay | Admitting: Physical Medicine and Rehabilitation

## 2013-11-17 DIAGNOSIS — M519 Unspecified thoracic, thoracolumbar and lumbosacral intervertebral disc disorder: Secondary | ICD-10-CM

## 2013-11-22 ENCOUNTER — Other Ambulatory Visit: Payer: Self-pay

## 2013-11-29 ENCOUNTER — Ambulatory Visit
Admission: RE | Admit: 2013-11-29 | Discharge: 2013-11-29 | Disposition: A | Discharge status: Home / Self Care | Payer: 59 | Source: Ambulatory Visit | Attending: Physical Medicine and Rehabilitation | Admitting: Physical Medicine and Rehabilitation

## 2013-11-29 VITALS — BP 103/71 | HR 65

## 2013-11-29 DIAGNOSIS — M519 Unspecified thoracic, thoracolumbar and lumbosacral intervertebral disc disorder: Secondary | ICD-10-CM

## 2013-11-29 MED ORDER — ONDANSETRON HCL 4 MG/2ML IJ SOLN
4.0000 mg | Freq: Once | INTRAMUSCULAR | Status: AC
  Administered 2013-11-29: 4 mg via INTRAMUSCULAR

## 2013-11-29 MED ORDER — DIAZEPAM 5 MG PO TABS
10.0000 mg | ORAL_TABLET | Freq: Once | ORAL | Status: AC
  Administered 2013-11-29: 10 mg via ORAL

## 2013-11-29 MED ORDER — MEPERIDINE HCL 100 MG/ML IJ SOLN
75.0000 mg | Freq: Once | INTRAMUSCULAR | Status: AC
  Administered 2013-11-29: 75 mg via INTRAMUSCULAR

## 2013-11-29 MED ORDER — IOHEXOL 300 MG/ML  SOLN
10.0000 mL | Freq: Once | INTRAMUSCULAR | Status: AC | PRN
  Administered 2013-11-29: 10 mL via INTRATHECAL

## 2013-11-29 NOTE — Discharge Instructions (Signed)

## 2013-12-01 ENCOUNTER — Telehealth: Payer: Self-pay | Admitting: Radiology

## 2013-12-01 NOTE — Telephone Encounter (Signed)
Pt had myelo on Wed. and is now c/o positional headache. Explained blood patch and bedrest oppositions to pt. She will try bedrest this weekend with increased fluids and caffeine. Will call Monday if still having problems

## 2013-12-13 ENCOUNTER — Emergency Department (INDEPENDENT_AMBULATORY_CARE_PROVIDER_SITE_OTHER)
Admission: EM | Admit: 2013-12-13 | Discharge: 2013-12-13 | Disposition: A | Discharge status: Home / Self Care | Payer: 59 | Source: Home / Self Care | Attending: Family Medicine | Admitting: Family Medicine

## 2013-12-13 ENCOUNTER — Encounter (HOSPITAL_COMMUNITY): Payer: Self-pay | Admitting: Emergency Medicine

## 2013-12-13 DIAGNOSIS — M722 Plantar fascial fibromatosis: Secondary | ICD-10-CM

## 2013-12-13 NOTE — Discharge Instructions (Signed)
Ice , stretch, advil, good supportive shoes, no barefoot, see specialist if further problems.

## 2013-12-13 NOTE — ED Provider Notes (Signed)
CSN: [id number]     Arrival date & time 12/13/13  1213 History   First MD Initiated Contact with Patient 12/13/13 1350     Chief Complaint  Patient presents with  . Foot Pain   (Consider location/radiation/quality/duration/timing/severity/associated sxs/prior Treatment) Patient is a 43 y.o. female presenting with lower extremity pain. The history is provided by the patient.  Foot Pain This is a new problem. The current episode started more than 1 week ago (sx for approx 1 mo.).    Past Medical History  Diagnosis Date  . Hyperlipidemia    Past Surgical History  Procedure Laterality Date  . Neck surgery    . Abdominal hysterectomy    . Elbow surgery     History reviewed. No pertinent family history. History  Substance Use Topics  . Smoking status: Never Smoker   . Smokeless tobacco: Not on file  . Alcohol Use: Yes     Comment: occasionally   OB History   Grav Para Term Preterm Abortions TAB SAB Ect Mult Living                 Review of Systems  Constitutional: Negative.   Musculoskeletal: Positive for gait problem. Negative for back pain, joint swelling and myalgias.  Skin: Negative.     Allergies  Review of patient's allergies indicates no known allergies.  Home Medications   Prior to Admission medications   Medication Sig Start Date End Date Taking? Authorizing Provider  desvenlafaxine (PRISTIQ) 100 MG 24 hr tablet Take 100 mg by mouth daily.    Historical Provider, MD  gabapentin (NEURONTIN) 300 MG capsule Take 1 capsule (300 mg total) by mouth 3 (three) times daily as needed. 09/12/13   Graylon GoodZachary H Baker, PA-C  HYDROcodone-acetaminophen (NORCO/VICODIN) 5-325 MG per tablet Take 1 tablet by mouth every 6 (six) hours as needed for pain. 07/27/12   Derwood KaplanAnkit Nanavati, MD  methocarbamol (ROBAXIN) 500 MG tablet Take 1 tablet (500 mg total) by mouth 2 (two) times daily. 07/27/12   Derwood KaplanAnkit Nanavati, MD  oxymorphone (OPANA ER) 20 MG 12 hr tablet Take 20 mg by mouth daily.      Historical Provider, MD  oxymorphone (OPANA) 10 MG tablet Take 10 mg by mouth 2 (two) times daily as needed for pain (breakthrough pain). For breakthrough pain.    Historical Provider, MD  predniSONE (DELTASONE) 50 MG tablet Take 1 tablet (50 mg total) by mouth daily. 07/27/12   Ankit Nanavati, MD   BP 130/80  Pulse 64  Temp(Src) 98.9 F (37.2 C)  SpO2 100% Physical Exam  Nursing note and vitals reviewed. Constitutional: She is oriented to person, place, and time. She appears well-developed and well-nourished.  Abdominal: Soft. Bowel sounds are normal.  Musculoskeletal: She exhibits tenderness.  bilat heel pad soreness locally, increases with achilles stretch.  Neurological: She is alert and oriented to person, place, and time.  Skin: Skin is warm and dry.    ED Course  Procedures (including critical care time) Labs Review Labs Reviewed - No data to display  Imaging Review No results found.   MDM   1. Plantar fasciitis, bilateral        Linna HoffJames D Maleki Hippe, MD 12/13/13 1438

## 2013-12-13 NOTE — ED Notes (Signed)
Both feet hurt, L>R x 1 month. motrin , rest helps

## 2013-12-18 NOTE — Telephone Encounter (Signed)
I'm scheduled to come in to see Dr. Charlsie Merlesegal on the 19th.  [facility] and got a referral.  I been following their instructions.  I'm experiencing a lot of pain right now, I'm not sure which office to call.  I'm calling to see what I need to do at this point.  I may just stop by there on my way home to see if there's anything I need to do.

## 2013-12-20 ENCOUNTER — Encounter (HOSPITAL_COMMUNITY): Payer: Self-pay | Admitting: Emergency Medicine

## 2013-12-20 ENCOUNTER — Emergency Department (HOSPITAL_COMMUNITY)
Admission: EM | Admit: 2013-12-20 | Discharge: 2013-12-20 | Disposition: A | Discharge status: Home / Self Care | Payer: 59 | Attending: Emergency Medicine | Admitting: Emergency Medicine

## 2013-12-20 ENCOUNTER — Emergency Department (HOSPITAL_COMMUNITY): Payer: 59

## 2013-12-20 DIAGNOSIS — G8929 Other chronic pain: Secondary | ICD-10-CM | POA: Diagnosis not present

## 2013-12-20 DIAGNOSIS — Y9389 Activity, other specified: Secondary | ICD-10-CM | POA: Insufficient documentation

## 2013-12-20 DIAGNOSIS — Z79899 Other long term (current) drug therapy: Secondary | ICD-10-CM | POA: Insufficient documentation

## 2013-12-20 DIAGNOSIS — M542 Cervicalgia: Secondary | ICD-10-CM | POA: Insufficient documentation

## 2013-12-20 DIAGNOSIS — W109XXA Fall (on) (from) unspecified stairs and steps, initial encounter: Secondary | ICD-10-CM | POA: Diagnosis not present

## 2013-12-20 DIAGNOSIS — S3992XA Unspecified injury of lower back, initial encounter: Secondary | ICD-10-CM | POA: Diagnosis present

## 2013-12-20 DIAGNOSIS — S39012A Strain of muscle, fascia and tendon of lower back, initial encounter: Secondary | ICD-10-CM | POA: Insufficient documentation

## 2013-12-20 DIAGNOSIS — Z8639 Personal history of other endocrine, nutritional and metabolic disease: Secondary | ICD-10-CM | POA: Diagnosis not present

## 2013-12-20 DIAGNOSIS — Z7952 Long term (current) use of systemic steroids: Secondary | ICD-10-CM | POA: Diagnosis not present

## 2013-12-20 DIAGNOSIS — Y9289 Other specified places as the place of occurrence of the external cause: Secondary | ICD-10-CM | POA: Insufficient documentation

## 2013-12-20 DIAGNOSIS — W19XXXA Unspecified fall, initial encounter: Secondary | ICD-10-CM

## 2013-12-20 MED ORDER — FENTANYL CITRATE 0.05 MG/ML IJ SOLN
100.0000 ug | Freq: Once | INTRAMUSCULAR | Status: AC
  Administered 2013-12-20: 100 ug via INTRAMUSCULAR
  Filled 2013-12-20: qty 2

## 2013-12-20 MED ORDER — OXYCODONE-ACETAMINOPHEN 5-325 MG PO TABS
1.0000 | ORAL_TABLET | Freq: Four times a day (QID) | ORAL | Status: DC | PRN
Start: 2013-12-20 — End: 2015-11-08

## 2013-12-20 MED ORDER — PREDNISONE 50 MG PO TABS: 50.0000 mg | ORAL_TABLET | Freq: Every day | ORAL | Status: DC

## 2013-12-20 MED ORDER — KETOROLAC TROMETHAMINE 60 MG/2ML IM SOLN
60.0000 mg | Freq: Once | INTRAMUSCULAR | Status: AC
  Administered 2013-12-20: 60 mg via INTRAMUSCULAR
  Filled 2013-12-20: qty 2

## 2013-12-20 NOTE — ED Provider Notes (Signed)
CSN: [id number]     Arrival date & time 12/20/13  1324 History  This chart was scribed for non-physician practitioner, Ebbie Ridgehris Churchill Grimsley, PA-C working with Elwin MochaBlair Walden, MD by Greggory StallionKayla Andersen, ED scribe. This patient was seen in room TR06C/TR06C and the patient's care was started at 2:28 PM.   Chief Complaint  Patient presents with  . Fall   The history is provided by the patient. No language interpreter was used.   HPI Comments: Mariah Anderson is a 43 y.o. female who presents to the Emergency Department complaining of a fall that occurred earlier today. States she tripped, fell down 3 steps and landed on her buttock on concrete. Reports neck pain and lower back pain. States she has a history of chronic neck and back pain that she takes daily opana for. Her last dose was prior to the fall.   Past Medical History  Diagnosis Date  . Hyperlipidemia    Past Surgical History  Procedure Laterality Date  . Neck surgery    . Abdominal hysterectomy    . Elbow surgery     History reviewed. No pertinent family history. History  Substance Use Topics  . Smoking status: Never Smoker   . Smokeless tobacco: Not on file  . Alcohol Use: Yes     Comment: occasionally   OB History   Grav Para Term Preterm Abortions TAB SAB Ect Mult Living                 Review of Systems All other systems negative except as documented in the HPI. All pertinent positives and negatives as reviewed in the HPI.  Allergies  Review of patient's allergies indicates no known allergies.  Home Medications   Prior to Admission medications   Medication Sig Start Date End Date Taking? Authorizing Provider  desvenlafaxine (PRISTIQ) 100 MG 24 hr tablet Take 100 mg by mouth daily.    Historical Provider, MD  gabapentin (NEURONTIN) 300 MG capsule Take 1 capsule (300 mg total) by mouth 3 (three) times daily as needed. 09/12/13   Graylon GoodZachary H Baker, PA-C  HYDROcodone-acetaminophen (NORCO/VICODIN) 5-325 MG per tablet Take 1 tablet by  mouth every 6 (six) hours as needed for pain. 07/27/12   Derwood KaplanAnkit Nanavati, MD  methocarbamol (ROBAXIN) 500 MG tablet Take 1 tablet (500 mg total) by mouth 2 (two) times daily. 07/27/12   Derwood KaplanAnkit Nanavati, MD  oxymorphone (OPANA ER) 20 MG 12 hr tablet Take 20 mg by mouth daily.     Historical Provider, MD  oxymorphone (OPANA) 10 MG tablet Take 10 mg by mouth 2 (two) times daily as needed for pain (breakthrough pain). For breakthrough pain.    Historical Provider, MD  predniSONE (DELTASONE) 50 MG tablet Take 1 tablet (50 mg total) by mouth daily. 07/27/12   Ankit Nanavati, MD   BP 130/88  Pulse 107  Temp(Src) 98.3 F (36.8 C)  Resp 18  SpO2 98%  Physical Exam  Nursing note and vitals reviewed. Constitutional: She is oriented to person, place, and time. She appears well-developed and well-nourished. No distress.  HENT:  Head: Normocephalic and atraumatic.  Eyes: Conjunctivae and EOM are normal.  Neck: Neck supple. No tracheal deviation present.  Cardiovascular: Normal rate.   Pulmonary/Chest: Effort normal. No respiratory distress.  Musculoskeletal: Normal range of motion.  Lower lumbar and sacral palpable pain.  Neurological: She is alert and oriented to person, place, and time.  Skin: Skin is warm and dry.  Psychiatric: She has a normal mood and  affect. Her behavior is normal.    ED Course  Procedures (including critical care time)  DIAGNOSTIC STUDIES: Oxygen Saturation is 98% on RA, normal by my interpretation.    COORDINATION OF CARE: 2:29 PM-Discussed treatment plan which includes xrays with pt at bedside and pt agreed to plan.    Imaging Review Dg Cervical Spine Complete  12/20/2013   CLINICAL DATA:  Larey SeatFell down steps. Back pain. Prior multiple neck surgeries.  EXAM: CERVICAL SPINE  4+ VIEWS  COMPARISON:  CT cervical spine 11/29/2013.  FINDINGS: Patient has had prior C4 through C6 anterior and interbody fusion. No evidence of fracture. Normal alignment. Normal mineralization.  Pulmonary apices are clear.  IMPRESSION: 1. No acute abnormality.  No evidence of fracture dislocation. 2. Stable surgical changes cervical spine.   Electronically Signed   By: Maisie Fushomas  Register   On: 12/20/2013 16:23   Dg Lumbar Spine Complete  12/20/2013   CLINICAL DATA:  Status post fall down 3 concrete steps today. Back pain. Initial encounter.  EXAM: LUMBAR SPINE - COMPLETE 4+ VIEW  COMPARISON:  Postmyelogram CT scan 11/29/2013.  FINDINGS: Vertebral body height and alignment are normal. No pars interarticularis defect is identified. There is some loss of disc space height at L5-S1 which is unchanged in appearance.  IMPRESSION: No acute finding.  Stable compared to prior exam.   Electronically Signed   By: Drusilla Kannerhomas  Dalessio M.D.   On: 12/20/2013 16:18   Dg Sacrum/coccyx  12/20/2013   CLINICAL DATA:  Fall than 3 concrete steps on to the sidewalk. Pain in the tail bone. Initial encounter.  EXAM: SACRUM AND COCCYX - 2+ VIEW  COMPARISON:  Lumbar myelogram 11/29/2013.  FINDINGS: AP and lateral views of the sacrum and coccyx demonstrate no acute fracture or significant interval change in alignment.  IMPRESSION: Negative.   Electronically Signed   By: Gennette Pachris  Mattern M.D.   On: 12/20/2013 16:18      I personally performed the services described in this documentation, which was scribed in my presence. The recorded information has been reviewed and is accurate.  Carlyle Dollyhristopher W Chace Klippel, PA-C 12/21/13 1552

## 2013-12-20 NOTE — ED Notes (Signed)
Per pt sts tripped and fell down some stairs. sts neck and back pain with hx of neck surgery.

## 2013-12-20 NOTE — Discharge Instructions (Signed)
Return here as needed. Follow up with your doctor for a recheck. °

## 2013-12-22 ENCOUNTER — Other Ambulatory Visit: Payer: Self-pay

## 2013-12-22 NOTE — ED Provider Notes (Signed)
Medical screening examination/treatment/procedure(s) were performed by non-physician practitioner and as supervising physician I was immediately available for consultation/collaboration.   EKG Interpretation None        Libia Fazzini, MD 12/22/13 0703 

## 2013-12-25 ENCOUNTER — Ambulatory Visit (INDEPENDENT_AMBULATORY_CARE_PROVIDER_SITE_OTHER): Payer: 59

## 2013-12-25 ENCOUNTER — Encounter: Payer: Self-pay | Admitting: Podiatry

## 2013-12-25 ENCOUNTER — Ambulatory Visit (INDEPENDENT_AMBULATORY_CARE_PROVIDER_SITE_OTHER): Payer: 59 | Admitting: Podiatry

## 2013-12-25 VITALS — BP 118/75 | HR 89 | Resp 16 | Ht 63.5 in | Wt 145.0 lb

## 2013-12-25 DIAGNOSIS — M722 Plantar fascial fibromatosis: Secondary | ICD-10-CM

## 2013-12-25 DIAGNOSIS — M2040 Other hammer toe(s) (acquired), unspecified foot: Secondary | ICD-10-CM

## 2013-12-25 MED ORDER — TRIAMCINOLONE ACETONIDE 10 MG/ML IJ SUSP
10.0000 mg | Freq: Once | INTRAMUSCULAR | Status: AC
Start: 2013-12-25 — End: 2013-12-25
  Administered 2013-12-25: 10 mg

## 2013-12-25 NOTE — Progress Notes (Signed)
   Subjective:    Patient ID: Mariah Anderson, female    DOB: November 16, 1970, 43 y.o.   MRN: [id number]  HPI Comments: "They have been hurting for awhile"  Patient c/o aching plantar heel bilateral, left over right, for several months. She has AM pain. Standing for long periods make worse. Went to Urgent Care, they said to stretch and ice and massaging. She takes pain meds for back, but doesn't help feet. She has tried Ibuprofen.      Review of Systems  HENT: Positive for sinus pressure.   Musculoskeletal: Positive for back pain.  Neurological: Positive for numbness.  All other systems reviewed and are negative.      Objective:   Physical Exam        Assessment & Plan:

## 2013-12-25 NOTE — Patient Instructions (Signed)

## 2013-12-26 NOTE — Progress Notes (Signed)
Subjective:     Patient ID: Mariah Anderson, female   DOB: 03/07/1971, 43 y.o.   MRN: [id number]  Foot Pain   patient states she's been having a lot of pain in her heel left over right and it's been going on for several months. She states that she's tried different anti-inflammatories supportive shoes and ice without relief of symptoms   Review of Systems  All other systems reviewed and are negative.      Objective:   Physical Exam  Nursing note and vitals reviewed. Constitutional: She is oriented to person, place, and time.  Cardiovascular: Intact distal pulses.   Musculoskeletal: Normal range of motion.  Neurological: She is oriented to person, place, and time.  Skin: Skin is warm.   neurovascular status is intact with muscle strength adequate and range of motion subtalar midtarsal joint within normal limits. Patient is found to be well oriented x3 has good digital perfusion and moderate equinus condition of both feet. Patient is noted to have discomfort in the plantar heel left over right with inflammation and fluid buildup noted     Assessment:     Plantar fasciitis of the heel left over right with inflammation    Plan:     H&P and x-rays reviewed. Injected the plantar fascia each heel 3 mg Kenalog 5 mg Xylocaine Marcaine mixture and for the left one I did dispense fascially brace with instructions on usage. Placed on diclofenac 75 mg twice a day and gave all instructions on physical therapy and reappoint her recheck in one week

## 2014-01-01 ENCOUNTER — Ambulatory Visit: Payer: 59 | Admitting: Podiatry

## 2014-02-19 ENCOUNTER — Encounter (HOSPITAL_COMMUNITY): Payer: Self-pay

## 2014-02-19 ENCOUNTER — Emergency Department (HOSPITAL_COMMUNITY)
Admission: EM | Admit: 2014-02-19 | Discharge: 2014-02-19 | Disposition: A | Discharge status: Home / Self Care | Payer: 59 | Attending: Emergency Medicine | Admitting: Emergency Medicine

## 2014-02-19 DIAGNOSIS — M545 Low back pain: Secondary | ICD-10-CM | POA: Diagnosis present

## 2014-02-19 DIAGNOSIS — R5383 Other fatigue: Secondary | ICD-10-CM | POA: Insufficient documentation

## 2014-02-19 DIAGNOSIS — M79651 Pain in right thigh: Secondary | ICD-10-CM | POA: Diagnosis not present

## 2014-02-19 DIAGNOSIS — R202 Paresthesia of skin: Secondary | ICD-10-CM | POA: Insufficient documentation

## 2014-02-19 DIAGNOSIS — N390 Urinary tract infection, site not specified: Secondary | ICD-10-CM | POA: Diagnosis not present

## 2014-02-19 DIAGNOSIS — Z8639 Personal history of other endocrine, nutritional and metabolic disease: Secondary | ICD-10-CM | POA: Diagnosis not present

## 2014-02-19 DIAGNOSIS — M5431 Sciatica, right side: Secondary | ICD-10-CM | POA: Diagnosis not present

## 2014-02-19 DIAGNOSIS — R630 Anorexia: Secondary | ICD-10-CM | POA: Insufficient documentation

## 2014-02-19 LAB — URINE MICROSCOPIC-ADD ON

## 2014-02-19 LAB — URINALYSIS, ROUTINE W REFLEX MICROSCOPIC
Bilirubin Urine: NEGATIVE
Glucose, UA: NEGATIVE mg/dL
Ketones, ur: NEGATIVE mg/dL
Nitrite: POSITIVE — AB
Protein, ur: NEGATIVE mg/dL
Specific Gravity, Urine: 1.011 (ref 1.005–1.030)
Urobilinogen, UA: 0.2 mg/dL (ref 0.0–1.0)
pH: 6 (ref 5.0–8.0)

## 2014-02-19 MED ORDER — CEPHALEXIN 250 MG PO CAPS
500.0000 mg | ORAL_CAPSULE | Freq: Once | ORAL | Status: AC
  Administered 2014-02-19: 500 mg via ORAL
  Filled 2014-02-19: qty 2

## 2014-02-19 MED ORDER — KETOROLAC TROMETHAMINE 60 MG/2ML IM SOLN
60.0000 mg | Freq: Once | INTRAMUSCULAR | Status: AC
Start: 2014-02-19 — End: 2014-02-19
  Administered 2014-02-19: 60 mg via INTRAMUSCULAR
  Filled 2014-02-19: qty 2

## 2014-02-19 MED ORDER — HYDROMORPHONE HCL 1 MG/ML IJ SOLN
1.0000 mg | Freq: Once | INTRAMUSCULAR | Status: AC
  Administered 2014-02-19: 1 mg via INTRAMUSCULAR
  Filled 2014-02-19: qty 1

## 2014-02-19 MED ORDER — HYDROMORPHONE HCL 1 MG/ML IJ SOLN
1.0000 mg | Freq: Once | INTRAMUSCULAR | Status: AC
Start: 2014-02-19 — End: 2014-02-19
  Administered 2014-02-19: 1 mg via INTRAMUSCULAR
  Filled 2014-02-19: qty 1

## 2014-02-19 MED ORDER — CEPHALEXIN 500 MG PO CAPS: 500.0000 mg | ORAL_CAPSULE | Freq: Three times a day (TID) | ORAL | Status: AC

## 2014-02-19 NOTE — ED Notes (Signed)
MD Mesner at bedside  °
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
## 2014-02-19 NOTE — ED Provider Notes (Signed)
CSN: [id number]     Arrival date & time 02/19/14  0721 History   None    Chief Complaint  Patient presents with  . Back Pain  . Leg Pain     (Consider location/radiation/quality/duration/timing/severity/associated sxs/prior Treatment) Patient is a 43 y.o. female presenting with back pain.  Back Pain Location:  Lumbar spine Quality:  Shooting and stabbing Radiates to:  R thigh Pain severity:  Mild Onset quality:  Gradual Duration:  3 days Timing:  Constant Progression:  Worsening Chronicity:  New Ineffective treatments:  NSAIDs, cold packs, being still and narcotics Associated symptoms: paresthesias (right posterior thigh)   Associated symptoms: no abdominal pain, no fever, no weakness and no weight loss     Past Medical History  Diagnosis Date  . Hyperlipidemia    Past Surgical History  Procedure Laterality Date  . Neck surgery    . Abdominal hysterectomy    . Elbow surgery    . Robotic assisted laparoscopic vaginal hysterectomy with fibroid removal     No family history on file. History  Substance Use Topics  . Smoking status: Never Smoker   . Smokeless tobacco: Not on file  . Alcohol Use: Yes     Comment: occasionally   OB History    No data available     Review of Systems  Constitutional: Positive for appetite change (decreased) and fatigue. Negative for fever and weight loss.  Gastrointestinal: Negative for nausea, vomiting and abdominal pain.  Endocrine: Negative for polydipsia and polyuria.  Musculoskeletal: Positive for back pain. Negative for joint swelling and neck pain.  Neurological: Positive for paresthesias (right posterior thigh). Negative for weakness.  All other systems reviewed and are negative.     Allergies  Review of patient's allergies indicates no known allergies.  Home Medications   Prior to Admission medications   Medication Sig Start Date End Date Taking? Authorizing Provider  oxymorphone (OPANA) 10 MG tablet Take 10 mg by  mouth 2 (two) times daily as needed for pain (breakthrough pain). For breakthrough pain.   Yes Historical Provider, MD  cephALEXin (KEFLEX) 500 MG capsule Take 1 capsule (500 mg total) by mouth 3 (three) times daily. 02/19/14 02/25/14  Marily MemosJason Moneka Mcquinn, MD  oxyCODONE-acetaminophen (PERCOCET/ROXICET) 5-325 MG per tablet Take 1 tablet by mouth every 6 (six) hours as needed for severe pain. Patient not taking: Reported on 02/19/2014 12/20/13   Jamesetta Orleanshristopher W Lawyer, PA-C  predniSONE (DELTASONE) 50 MG tablet Take 1 tablet (50 mg total) by mouth daily. Patient not taking: Reported on 02/19/2014 12/20/13   Jamesetta Orleanshristopher W Lawyer, PA-C   BP 115/75 mmHg  Pulse 82  Temp(Src) 98.6 F (37 C) (Oral)  Resp 16  Ht 5\' 3"  (1.6 m)  Wt 155 lb (70.308 kg)  BMI 27.46 kg/m2  SpO2 100% Physical Exam  Constitutional: She is oriented to person, place, and time. She appears well-developed and well-nourished.  HENT:  Head: Normocephalic and atraumatic.  Eyes: Conjunctivae and EOM are normal. Right eye exhibits no discharge. Left eye exhibits no discharge.  Cardiovascular: Normal rate and regular rhythm.   Pulmonary/Chest: Effort normal and breath sounds normal. No respiratory distress.  Abdominal: Soft. She exhibits no distension. There is no tenderness. There is no rebound.  Musculoskeletal: Normal range of motion. She exhibits no edema or tenderness.  Neurological: She is alert and oriented to person, place, and time.  Bilateral equal patellar DTR, slight decreased sensation right lateral thigh. No saddle anesthesia, no suprapubic ttp or fullness. Equal sensation and  motor distally.   Skin: Skin is warm and dry.  Nursing note and vitals reviewed.   ED Course  Procedures (including critical care time) Labs Review Labs Reviewed  URINALYSIS, ROUTINE W REFLEX MICROSCOPIC - Abnormal; Notable for the following:    APPearance CLOUDY (*)    Hgb urine dipstick MODERATE (*)    Nitrite POSITIVE (*)    Leukocytes, UA  SMALL (*)    All other components within normal limits  URINE MICROSCOPIC-ADD ON - Abnormal; Notable for the following:    Squamous Epithelial / LPF MANY (*)    Bacteria, UA MANY (*)    All other components within normal limits  URINE CULTURE    Imaging Review No results found.   EKG Interpretation None      MDM   Final diagnoses:  UTI (lower urinary tract infection)  Sciatica, right    43 yo F here with s/s c/w sciatica. Minimally decreased sensation in R lateral thigh, otherwise no neurologic findings. No evidence of cauda equina, spinal abscess, or other acute emergent cause of her back pain. No trauma needing further imaging. A urine was checked and is consistent with a UTI. This likely explains her fatigue and decreased appetite over the last 24-48 hours. We will treat her for that and she will follow up with her pain doctors for her back pain if it continues.    Marily MemosJason Madhavi Hamblen, MD 02/19/14 1630  Rolan BuccoMelanie Belfi, MD 02/20/14 [phone]

## 2014-02-21 LAB — URINE CULTURE: Colony Count: >=100000

## 2014-02-22 ENCOUNTER — Telehealth (HOSPITAL_COMMUNITY): Payer: Self-pay

## 2014-02-22 NOTE — ED Notes (Signed)
Post ED Visit - Positive Culture Follow-up  Culture report reviewed by antimicrobial stewardship pharmacist: [x]  Wes Dulaney, Pharm.D., BCPS []  Celedonio MiyamotoJeremy Frens, Pharm.D., BCPS []  Georgina PillionElizabeth Taplin, Pharm.D., BCPS []  UvaldeMinh Pham, [address]Ph[address]D., BCPS, AAHIVP []  Estella HuskMichelle Turner, Pharm.D., BCPS, AAHIVP []  Elder CyphersLorie Poole, [address]Ph[address]D., BCPS  Positive urine culture Treated with cephalexin , organism sensitive to the same and no further patient follow-up is required at this time.  Ashley JacobsFesterman, Elick Aguilera C 02/22/2014, 8:59 AM

## 2015-09-15 IMAGING — CR DG LUMBAR SPINE COMPLETE 4+V
5 series · 5 of 5 positions shown · non-contrast
Comparison: Postmyelogram CT scan 11/29/2013.

CLINICAL DATA: Status post fall down 3 concrete steps today. Back
pain. Initial encounter.

EXAM:
LUMBAR SPINE - COMPLETE 4+ VIEW

[t l-spine a.p.]
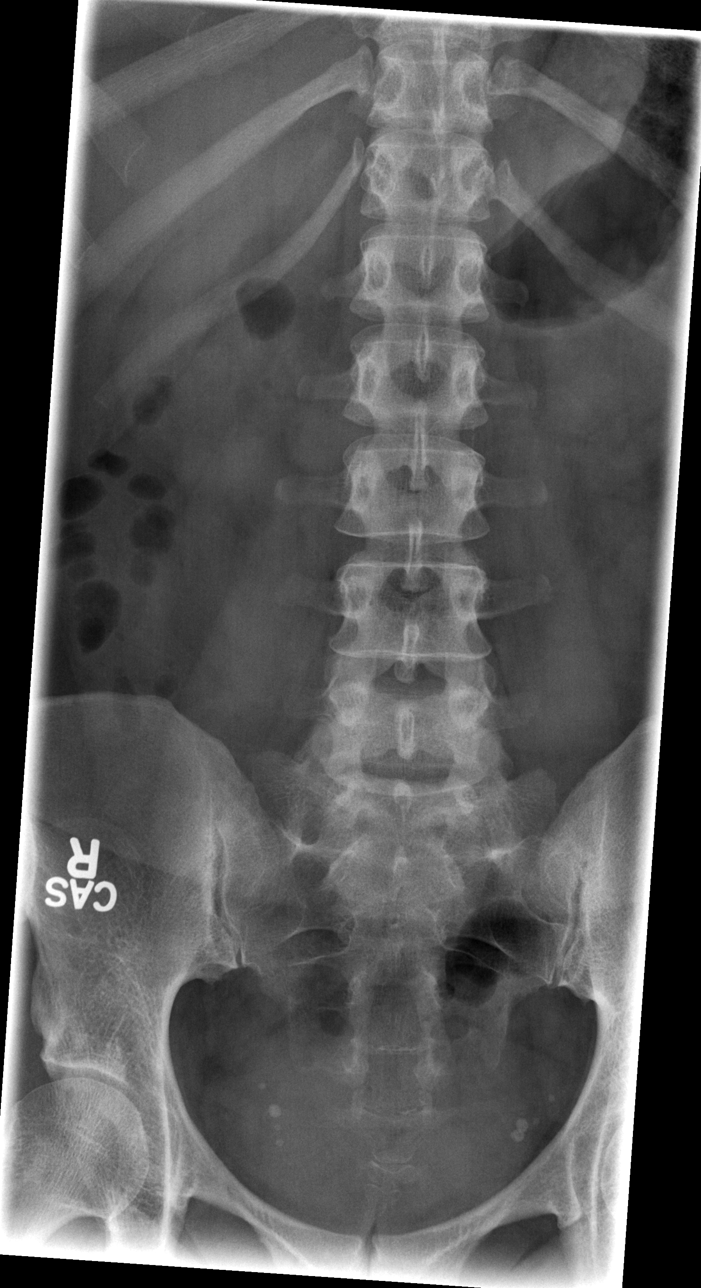

[t l-spine oblique exposure (1 of 2)]
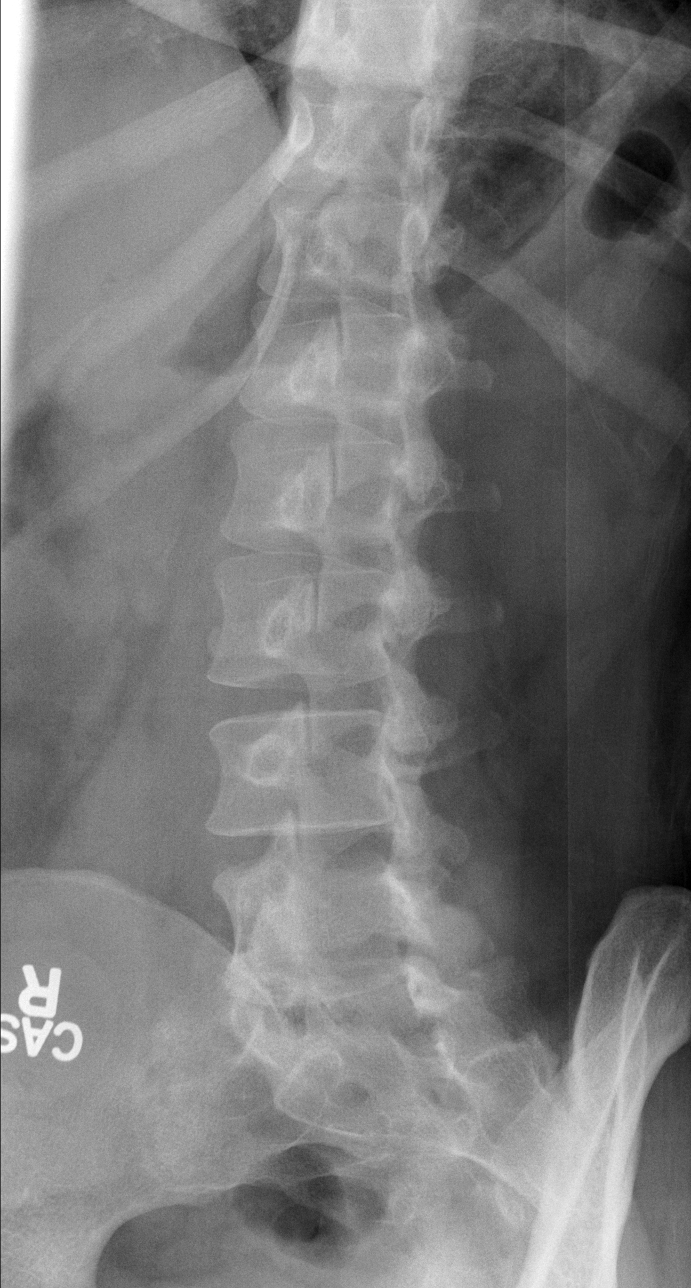

[t l-spine oblique exposure (2 of 2)]
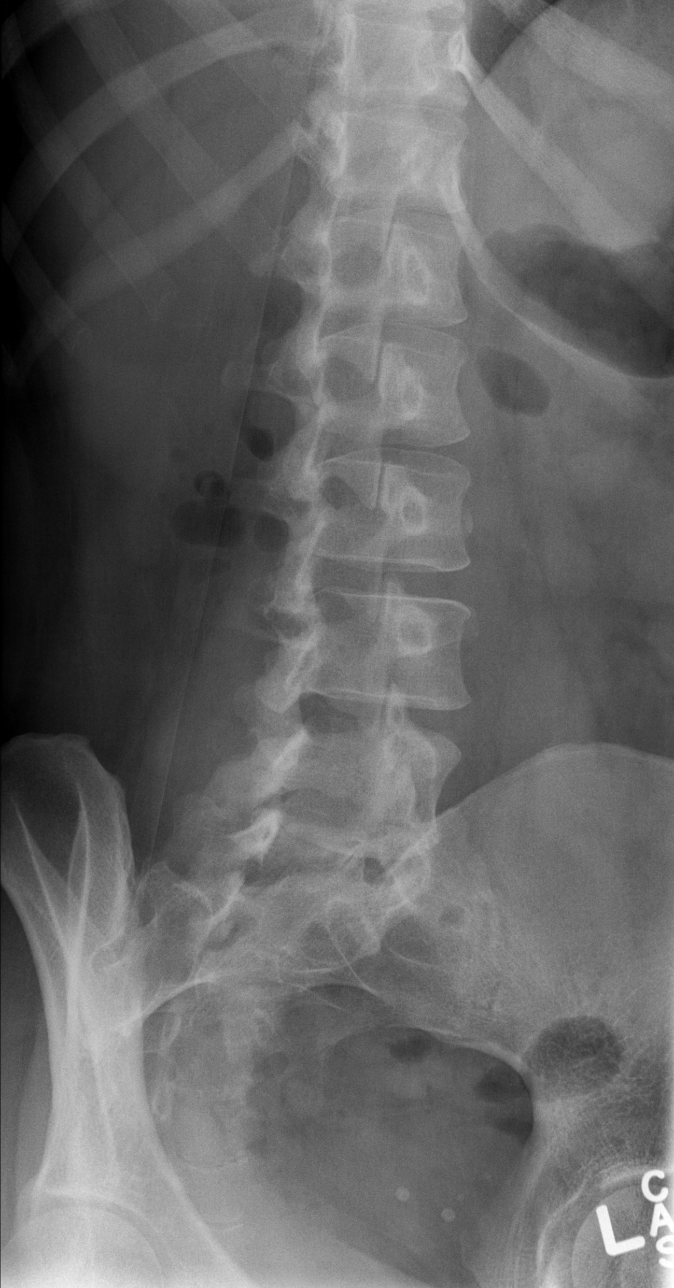

[t l-spine lat]
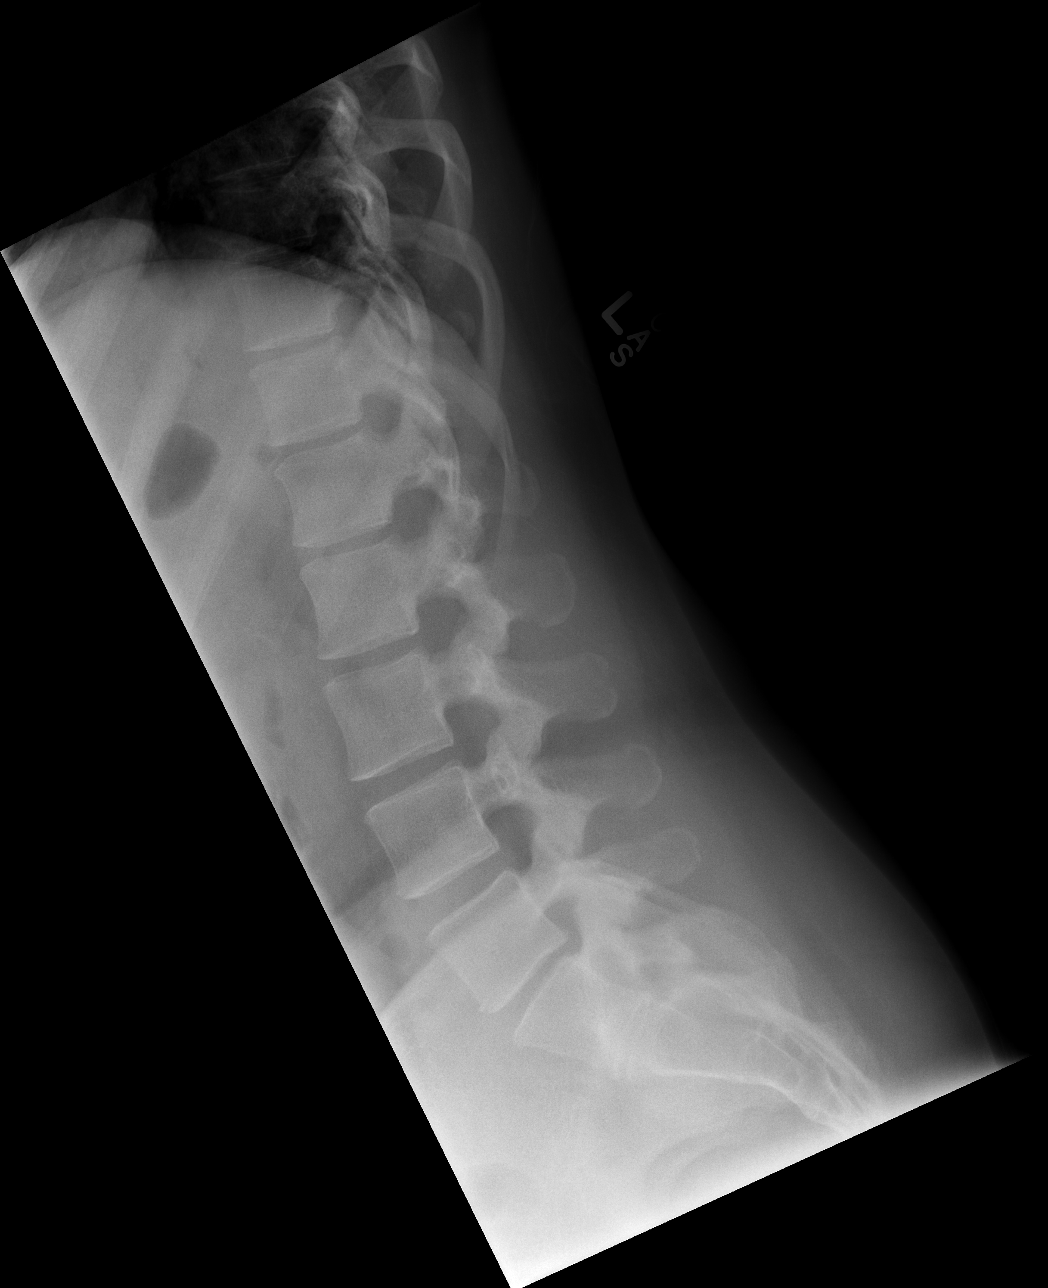

[t l-spine l5-s1 spot]
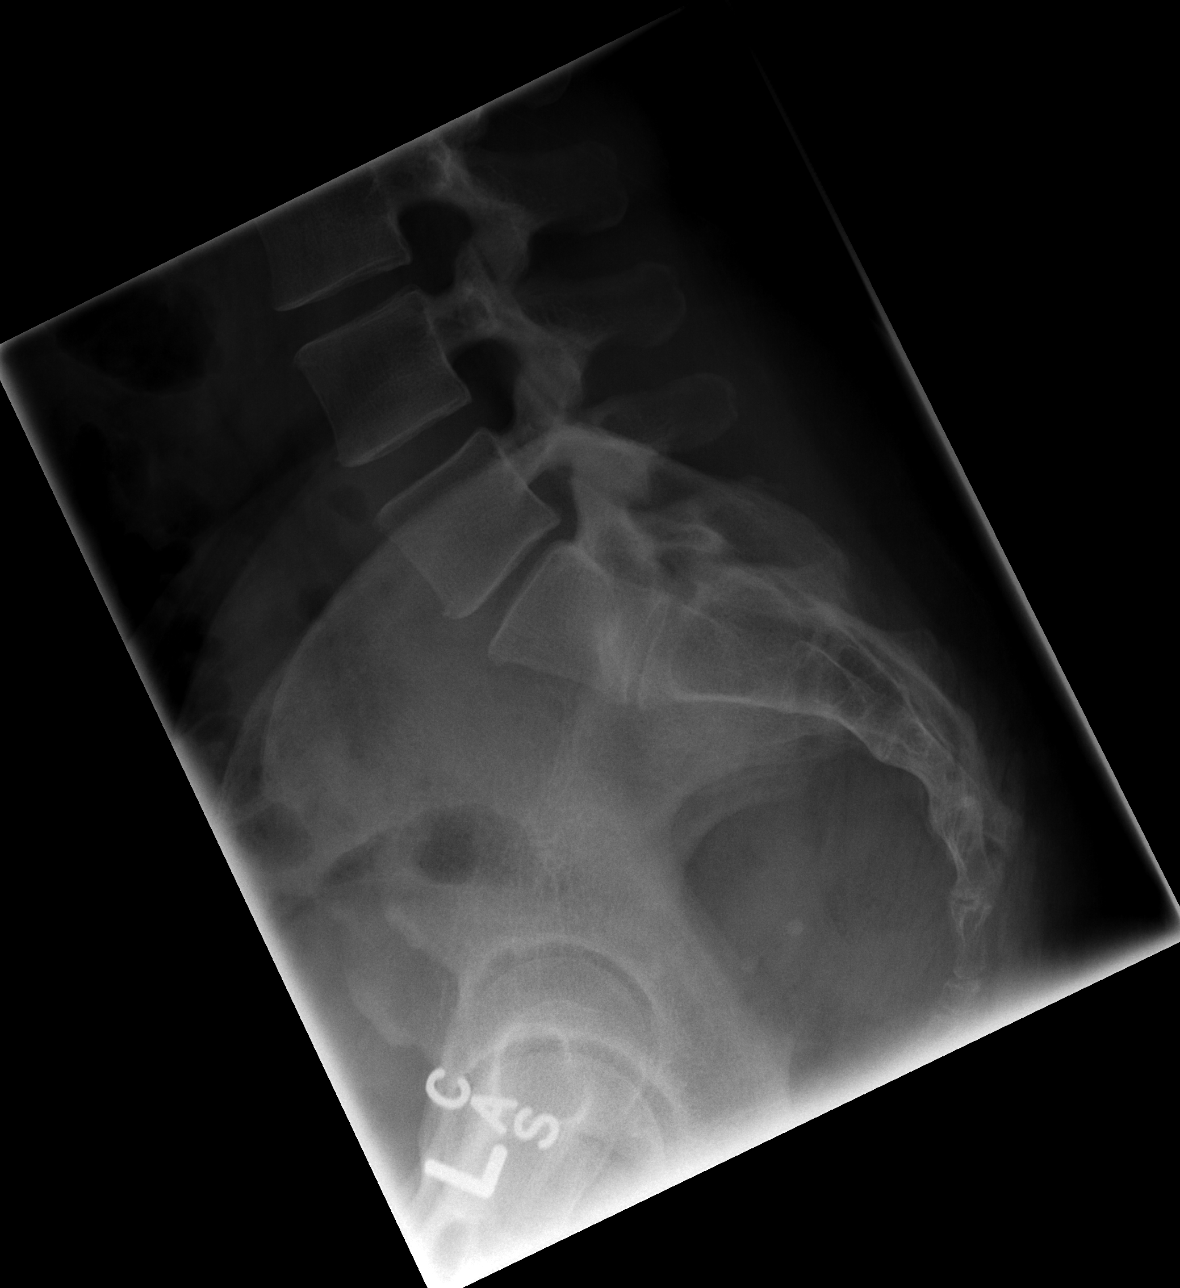

[5 of 5 positions shown; findings below may reference images not displayed]

FINDINGS: Vertebral body height and alignment are normal. No pars
interarticularis defect is identified. There is some loss of disc
space height at L5-S1 which is unchanged in appearance.
IMPRESSION: No acute finding.  Stable compared to prior exam.

## 2015-09-15 IMAGING — CR DG CERVICAL SPINE COMPLETE 4+V
6 series · 6 of 6 positions shown · non-contrast
Comparison: CT cervical spine 11/29/2013.

CLINICAL DATA: Fell down steps. Back pain. Prior multiple neck
surgeries.

EXAM:
CERVICAL SPINE  4+ VIEWS

[w c-spine lat]
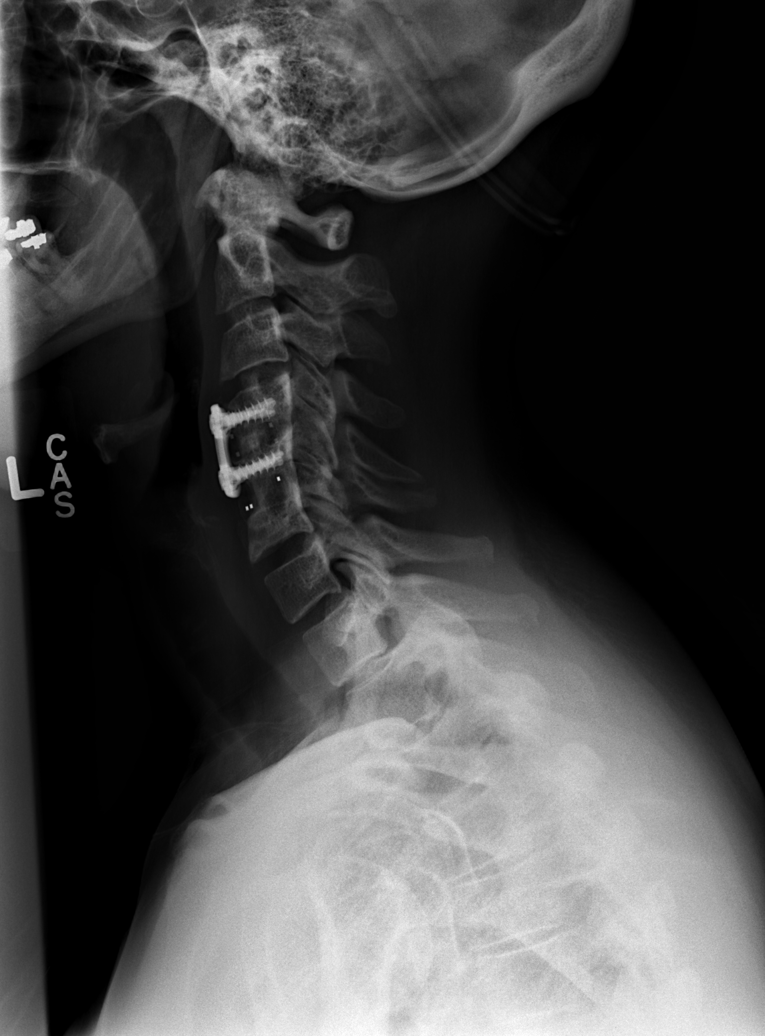

[w c-spine oblique (1 of 2)]
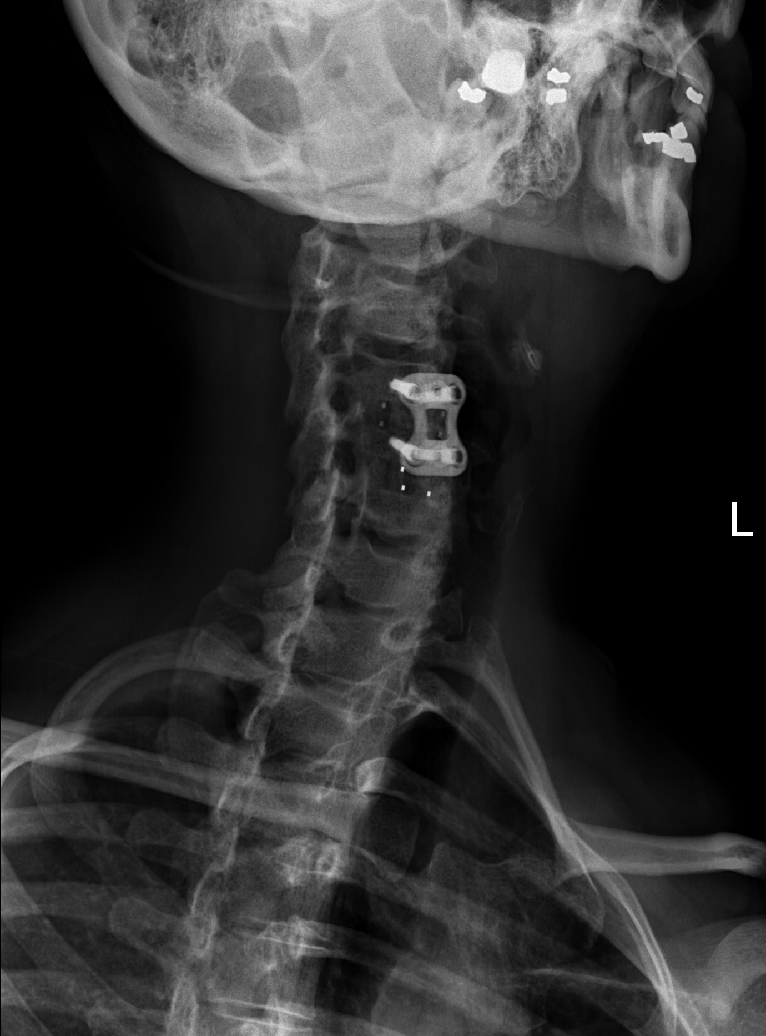

[w c-spine oblique (2 of 2)]
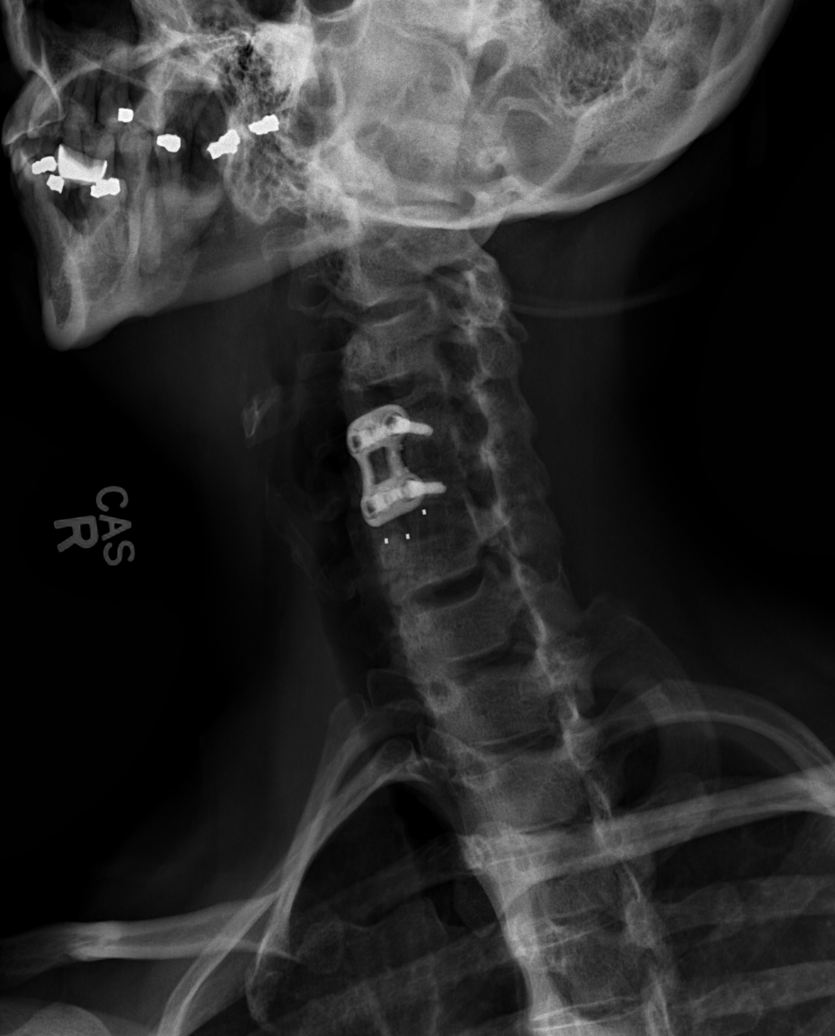

[w c-spine a.p.]
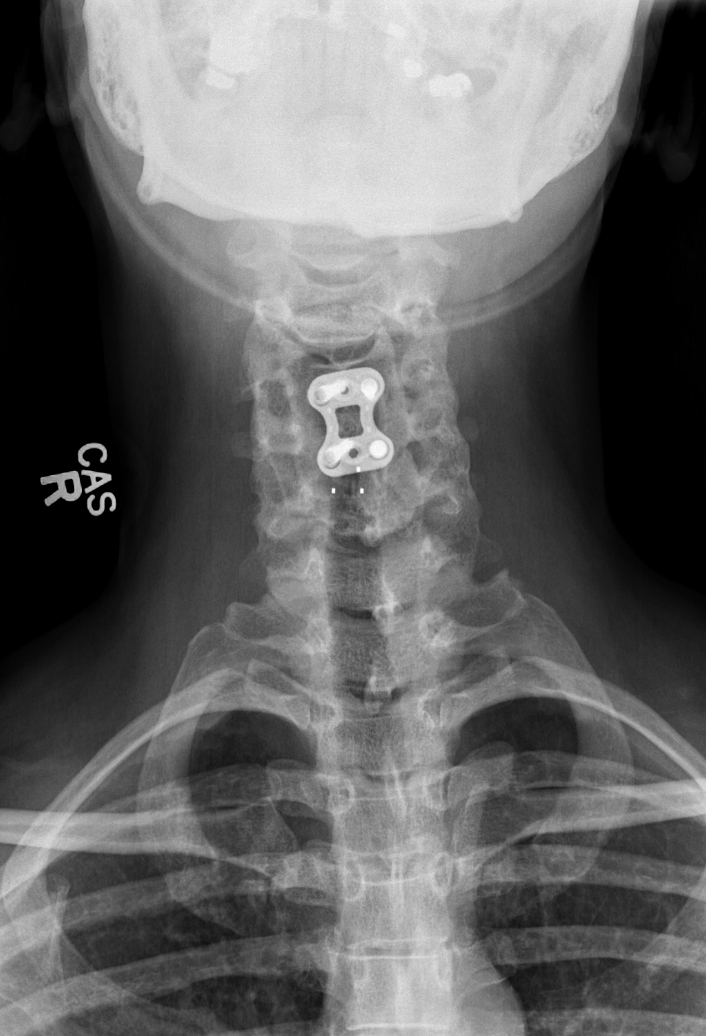

[w c-spine odontoid]
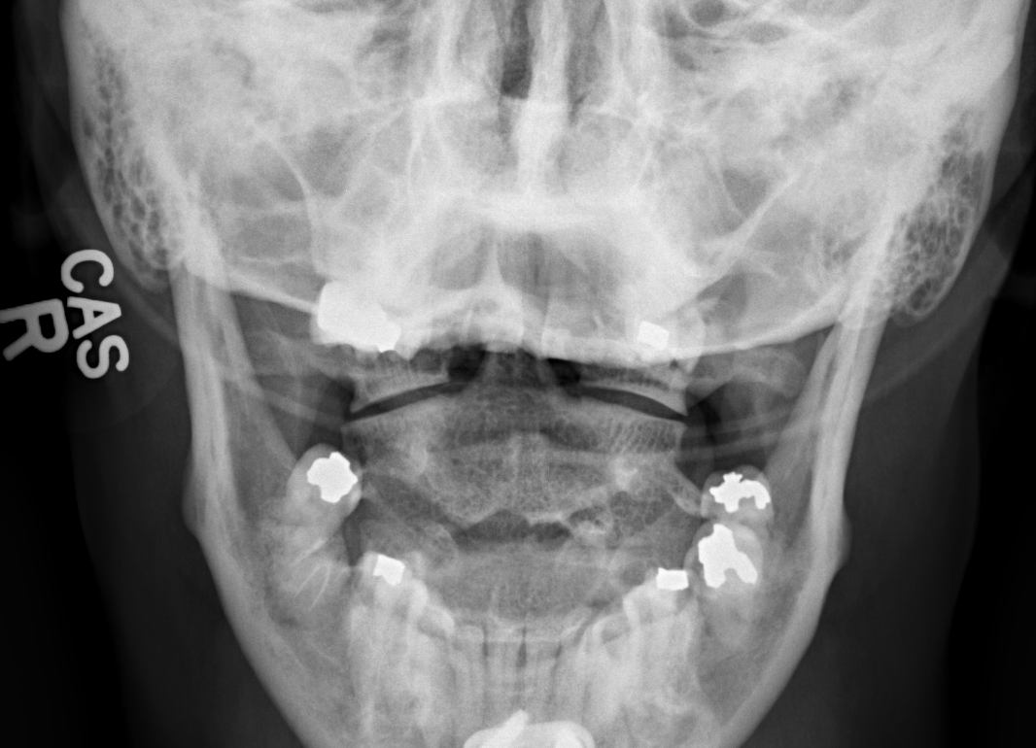

[w swimmers view]
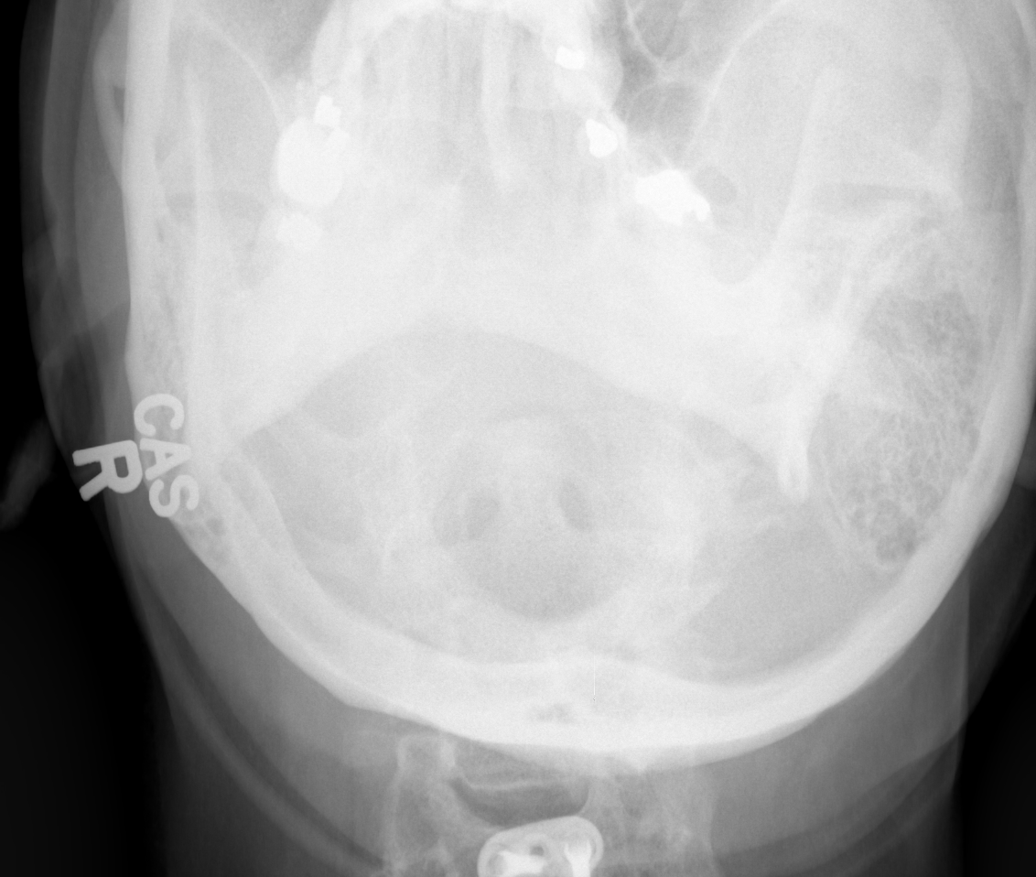

[6 of 6 positions shown; findings below may reference images not displayed]

FINDINGS: Patient has had prior C4 through C6 anterior and interbody fusion.
No evidence of fracture. Normal alignment. Normal mineralization.
Pulmonary apices are clear.
IMPRESSION: 1. No acute abnormality.  No evidence of fracture dislocation.
2. Stable surgical changes cervical spine.

## 2015-11-08 ENCOUNTER — Emergency Department (HOSPITAL_COMMUNITY)
Admission: EM | Admit: 2015-11-08 | Discharge: 2015-11-09 | Disposition: A | Discharge status: Home / Self Care | Payer: 59 | Attending: Emergency Medicine | Admitting: Emergency Medicine

## 2015-11-08 ENCOUNTER — Encounter (HOSPITAL_COMMUNITY): Payer: Self-pay | Admitting: Emergency Medicine

## 2015-11-08 DIAGNOSIS — R42 Dizziness and giddiness: Secondary | ICD-10-CM | POA: Insufficient documentation

## 2015-11-08 DIAGNOSIS — R209 Unspecified disturbances of skin sensation: Secondary | ICD-10-CM | POA: Diagnosis not present

## 2015-11-08 DIAGNOSIS — R202 Paresthesia of skin: Secondary | ICD-10-CM

## 2015-11-08 DIAGNOSIS — R51 Headache: Secondary | ICD-10-CM | POA: Diagnosis not present

## 2015-11-08 DIAGNOSIS — R519 Headache, unspecified: Secondary | ICD-10-CM

## 2015-11-08 LAB — CBC WITH DIFFERENTIAL/PLATELET
Basophils Absolute: 0 10*3/uL (ref 0.0–0.1)
Basophils Relative: 0 %
Eosinophils Absolute: 0 10*3/uL (ref 0.0–0.7)
Eosinophils Relative: 0 %
HCT: 44.3 % (ref 36.0–46.0)
HEMOGLOBIN: 14.7 g/dL (ref 12.0–15.0)
Lymphocytes Relative: 47 %
Lymphs Abs: 2.8 10*3/uL (ref 0.7–4.0)
MCH: 30.3 pg (ref 26.0–34.0)
MCHC: 33.2 g/dL (ref 30.0–36.0)
MCV: 91.3 fL (ref 78.0–100.0)
MONOS PCT: 10 %
Monocytes Absolute: 0.6 10*3/uL (ref 0.1–1.0)
Neutro Abs: 2.5 10*3/uL (ref 1.7–7.7)
Neutrophils Relative %: 43 %
Platelets: 294 10*3/uL (ref 150–400)
RBC: 4.85 MIL/uL (ref 3.87–5.11)
RDW: 13.2 % (ref 11.5–15.5)
WBC: 5.8 10*3/uL (ref 4.0–10.5)

## 2015-11-08 LAB — COMPREHENSIVE METABOLIC PANEL
ALK PHOS: 49 U/L (ref 38–126)
ALT: 24 U/L (ref 14–54)
ANION GAP: 13 (ref 5–15)
AST: 24 U/L (ref 15–41)
Albumin: 4.4 g/dL (ref 3.5–5.0)
BUN: <5 mg/dL — ABNORMAL LOW (ref 6–20)
CO2: 20 mmol/L — AB (ref 22–32)
Calcium: 9.7 mg/dL (ref 8.9–10.3)
Chloride: 104 mmol/L (ref 101–111)
Creatinine, Ser: 0.8 mg/dL (ref 0.44–1.00)
GFR calc Af Amer: >60 mL/min (ref >60)
GFR calc non Af Amer: >60 mL/min (ref >60)
GLUCOSE: 125 mg/dL — AB (ref 65–99)
POTASSIUM: 3.3 mmol/L — AB (ref 3.5–5.1)
SODIUM: 137 mmol/L (ref 135–145)
Total Bilirubin: 0.6 mg/dL (ref 0.3–1.2)
Total Protein: 8.1 g/dL (ref 6.5–8.1)

## 2015-11-08 MED ORDER — MECLIZINE HCL 25 MG PO TABS
25.0000 mg | ORAL_TABLET | Freq: Once | ORAL | Status: AC
Start: 2015-11-08 — End: 2015-11-08
  Administered 2015-11-08: 25 mg via ORAL
  Filled 2015-11-08: qty 1

## 2015-11-08 MED ORDER — PROCHLORPERAZINE EDISYLATE 5 MG/ML IJ SOLN
10.0000 mg | Freq: Once | INTRAMUSCULAR | Status: AC
  Administered 2015-11-09: 10 mg via INTRAMUSCULAR
  Filled 2015-11-08: qty 2

## 2015-11-08 MED ORDER — ACETAMINOPHEN 500 MG PO TABS
1000.0000 mg | ORAL_TABLET | Freq: Once | ORAL | Status: AC
  Administered 2015-11-08: 1000 mg via ORAL
  Filled 2015-11-08: qty 2

## 2015-11-08 NOTE — ED Triage Notes (Addendum)
Patient reports elevated blood pressure today 160/100 with right arm numbness/tingling at fingers . Denies pain / respirations unlabored . Equal strong grips with no arm drift . Speech clear with no facial asymmetry.

## 2015-11-08 NOTE — ED Provider Notes (Signed)
[facility]-EMERGENCY DEPT Provider Note   CSN: [id number] Arrival date & time: 11/08/15  1948     History   Chief Complaint Chief Complaint  Patient presents with  . Hypertension    HPI Mariah Anderson is a 45 y.o. female.  The history is provided by the patient.  Dizziness  Quality:  Lightheadedness Severity:  Mild Onset quality:  Gradual Duration: several hours. Timing:  Constant Progression:  Unchanged Chronicity:  Recurrent (occurred previously for has not occurred in several years.) Relieved by:  Nothing Worsened by:  Sitting upright, turning head and eye movement Ineffective treatments:  None tried Associated symptoms: headaches   Associated symptoms: no blood in stool, no chest pain, no diarrhea, no nausea, no palpitations, no shortness of breath, no tinnitus, no vision changes and no vomiting    CC: Headache  Onset/Duration: several hours Timing: constant Location: frontal Quality: aching Severity: moderate Modifying Factors:  Improved by: nothing  Worsened by: leaning forwared Associated Signs/Symptoms:  Pertinent (+): rhinorrhea and congestion  Pertinent (-): fever, vision changes, focal weakness.  CC: right arm numbness  Onset/Duration: several hours ago Timing: constant Location: right arm Quality: numbness/tingling Severity: moderate Modifying Factors:  Improved by: nothing  Worsened by: nothing Context: Patient with a history of cervical herniated disc status post fixation with recurrent right upper extremity radiculopathies. This is similar to prior, but more intense.   Past Medical History:  Diagnosis Date  . Hyperlipidemia     There are no active problems to display for this patient.   Past Surgical History:  Procedure Laterality Date  . ABDOMINAL HYSTERECTOMY    . ELBOW SURGERY    . NECK SURGERY    . ROBOTIC ASSISTED LAPAROSCOPIC VAGINAL HYSTERECTOMY WITH FIBROID REMOVAL      OB History    No data available       Home  Medications    Prior to Admission medications   Medication Sig Start Date End Date Taking? Authorizing Provider  oxymorphone (OPANA) 10 MG tablet Take 10 mg by mouth 2 (two) times daily as needed for pain (breakthrough pain). For breakthrough pain.   Yes Historical Provider, MD  meclizine (ANTIVERT) 25 MG tablet Take 1 tablet (25 mg total) by mouth 3 (three) times daily as needed for dizziness. 11/09/15   Nira Conn, MD    Family History No family history on file.  Social History Social History  Substance Use Topics  . Smoking status: Never Smoker  . Smokeless tobacco: Never Used  . Alcohol use Yes     Comment: occasionally     Allergies   Review of patient's allergies indicates no known allergies.   Review of Systems Review of Systems  Constitutional: Negative for chills and fever.  HENT: Positive for congestion and rhinorrhea. Negative for ear pain, sore throat and tinnitus.   Eyes: Negative for pain and visual disturbance.  Respiratory: Negative for cough and shortness of breath.   Cardiovascular: Negative for chest pain and palpitations.  Gastrointestinal: Negative for abdominal pain, blood in stool, diarrhea, nausea and vomiting.  Genitourinary: Negative for dysuria and hematuria.  Musculoskeletal: Negative for arthralgias and back pain.  Skin: Negative for color change and rash.  Neurological: Positive for dizziness and headaches. Negative for seizures and syncope.  All other systems reviewed and are negative.    Physical Exam Updated Vital Signs BP 114/88   Pulse 86   Temp 98.1 F (36.7 C) (Oral)   Resp 23   Ht 5\' 3"  (1.6  m)   Wt 160 lb (72.6 kg)   SpO2 99%   BMI 28.34 kg/m   Physical Exam  Constitutional: She is oriented to person, place, and time. She appears well-developed and well-nourished. No distress.  HENT:  Head: Normocephalic and atraumatic.  Nose: Nose normal.  Eyes: Conjunctivae and EOM are normal. Pupils are equal, round, and  reactive to light. Right eye exhibits no discharge. Left eye exhibits no discharge. No scleral icterus.  Neck: Normal range of motion. Neck supple.  Cardiovascular: Normal rate and regular rhythm.  Exam reveals no gallop and no friction rub.   No murmur heard. Pulmonary/Chest: Effort normal and breath sounds normal. No stridor. No respiratory distress. She has no rales.  Abdominal: Soft. She exhibits no distension. There is no tenderness.  Musculoskeletal: She exhibits no edema or tenderness.  Spurling test positive  Neurological: She is alert and oriented to person, place, and time.  Mental Status: Alert and oriented to person, place, and time. Attention and concentration normal. Speech clear. Recent memory is intac  Cranial Nerves  II Visual Fields: Intact to confrontation. Visual fields intact. III, IV, VI: Pupils equal and reactive to light and near. Full eye movement without nystagmus  V Facial Sensation: Normal. No weakness of masticatory muscles  VII: No facial weakness or asymmetry  VIII Auditory Acuity: Grossly normal  IX/X: The uvula is midline; the palate elevates symmetrically  XI: Normal sternocleidomastoid and trapezius strength  XII: The tongue is midline. No atrophy or fasciculations.   Motor System: Muscle Strength: 5/5 and symmetric in the upper and lower extremities. No pronation or drift.  Muscle Tone: Tone and muscle bulk are normal in the upper and lower extremities.   Reflexes: DTRs: 2+ and symmetrical in all four extremities. Plantar responses are flexor bilaterally.  Coordination: Intact finger-to-nose, heel-to-shin, and rapid alternating movements. No tremor.  Sensation: Intact to light touch, and pinprick. Negative Romberg test.  Gait: Routine and tandem gait are normal   HINTS: Abnormal head impulse Normal test of skew  Ocular movement reproduce symptoms.  Skin: Skin is warm and dry. No rash noted. She is not diaphoretic. No erythema.  Psychiatric: She  has a normal mood and affect.  Vitals reviewed.    ED Treatments / Results  Labs (all labs ordered are listed, but only abnormal results are displayed) Labs Reviewed  COMPREHENSIVE METABOLIC PANEL - Abnormal; Notable for the following:       Result Value   Potassium 3.3 (*)    CO2 20 (*)    Glucose, Bld 125 (*)    BUN <5 (*)    All other components within normal limits  CBC WITH DIFFERENTIAL/PLATELET    EKG  EKG Interpretation None       Radiology No results found.  Procedures Procedures (including critical care time)  Medications Ordered in ED Medications  meclizine (ANTIVERT) tablet 25 mg (25 mg Oral Given 11/08/15 2155)  acetaminophen (TYLENOL) tablet 1,000 mg (1,000 mg Oral Given 11/08/15 2154)  prochlorperazine (COMPAZINE) injection 10 mg (10 mg Intramuscular Given 11/09/15 0003)     Initial Impression / Assessment and Plan / ED Course  I have reviewed the triage vital signs and the nursing notes.  Pertinent labs & imaging results that were available during my care of the patient were reviewed by me and considered in my medical decision making (see chart for details).  Clinical Course    1. Headache Frontal headache with rhinorrhea and congestion. Likely sinus headache. Non focal  neuro exam. No recent head trauma. No fever. Doubt meningitis. Doubt intracranial bleed. Doubt IIH. No indication for imaging. Given tylenol and compazine with mild improvement.   2. Dizziness No focal weakness. HINTS exam suggestive of peripheral cause. Sinus headache may be contributing factor. Low suspicion for central process. Improved with meclizine.  3. Right arm radiculopathy Reproduced. No weakness. Low suspicion for central process. NSU follow up.  Final Clinical Impressions(s) / ED Diagnoses   Final diagnoses:  Dizziness  Nonintractable episodic headache, unspecified headache type  Arm paresthesia, right   Disposition: Discharge  Condition: Good  I have discussed  the results, Dx and Tx plan with the patient who expressed understanding and agree(s) with the plan. Discharge instructions discussed at great length. The patient was given strict return precautions who verbalized understanding of the instructions. No further questions at time of discharge.    New Prescriptions   MECLIZINE (ANTIVERT) 25 MG TABLET    Take 1 tablet (25 mg total) by mouth 3 (three) times daily as needed for dizziness.    Follow Up: [facility] [address] [place] [address] [phone] Call  For help establishing care with a care provider      Nira Conn, MD 11/09/15 [phone]

## 2015-11-08 NOTE — ED Notes (Signed)
Report to Mario 

## 2015-11-08 NOTE — ED Notes (Signed)
Patient complans of dizzyness, htn, and right arm numb and tingling, hx of neck surgery and MD wanted pt to be evaluted to r/o issue with neck surgery or neuro/brian.

## 2015-11-08 NOTE — ED Notes (Signed)
Pt on stretcher, MD at bedside now.

## 2015-11-09 MED ORDER — MECLIZINE HCL 25 MG PO TABS: 25.0000 mg | ORAL_TABLET | Freq: Three times a day (TID) | ORAL | 0 refills | Status: AC | PRN
